# Patient Record
Sex: Female | Born: 1935 | Race: White | Hispanic: No | Marital: Married | Smoking: Current every day smoker
Health system: Southern US, Community
[De-identification: ages and names within clinical notes are randomized; demographics above are authoritative.]

## PROBLEM LIST (undated history)

## (undated) DIAGNOSIS — I10 Essential (primary) hypertension: Secondary | ICD-10-CM

## (undated) DIAGNOSIS — M899 Disorder of bone, unspecified: Secondary | ICD-10-CM

## (undated) DIAGNOSIS — D493 Neoplasm of unspecified behavior of breast: Secondary | ICD-10-CM

## (undated) DIAGNOSIS — E785 Hyperlipidemia, unspecified: Secondary | ICD-10-CM

## (undated) DIAGNOSIS — Z720 Tobacco use: Secondary | ICD-10-CM

## (undated) DIAGNOSIS — F419 Anxiety disorder, unspecified: Secondary | ICD-10-CM

## (undated) DIAGNOSIS — I2510 Atherosclerotic heart disease of native coronary artery without angina pectoris: Secondary | ICD-10-CM

## (undated) DIAGNOSIS — K219 Gastro-esophageal reflux disease without esophagitis: Secondary | ICD-10-CM

## (undated) DIAGNOSIS — T7840XA Allergy, unspecified, initial encounter: Secondary | ICD-10-CM

## (undated) DIAGNOSIS — M949 Disorder of cartilage, unspecified: Secondary | ICD-10-CM

## (undated) DIAGNOSIS — D126 Benign neoplasm of colon, unspecified: Secondary | ICD-10-CM

## (undated) HISTORY — DX: Allergy, unspecified, initial encounter: T78.40XA

## (undated) HISTORY — DX: Benign neoplasm of colon, unspecified: D12.6

## (undated) HISTORY — DX: Disorder of cartilage, unspecified: M94.9

## (undated) HISTORY — DX: Anxiety disorder, unspecified: F41.9

## (undated) HISTORY — DX: Atherosclerotic heart disease of native coronary artery without angina pectoris: I25.10

## (undated) HISTORY — DX: Gastro-esophageal reflux disease without esophagitis: K21.9

## (undated) HISTORY — DX: Hyperlipidemia, unspecified: E78.5

## (undated) HISTORY — DX: Essential (primary) hypertension: I10

## (undated) HISTORY — DX: Disorder of bone, unspecified: M89.9

## (undated) HISTORY — PX: CARDIAC CATHETERIZATION: SHX172

## (undated) HISTORY — DX: Tobacco use: Z72.0

## (undated) HISTORY — DX: Neoplasm of unspecified behavior of breast: D49.3

## (undated) HISTORY — PX: DESCENDING AORTIC ANEURYSM REPAIR W/ STENT: SHX1456

## (undated) HISTORY — PX: CAROTID ENDARTERECTOMY: SUR193

---

## 1997-12-11 ENCOUNTER — Encounter: Payer: Self-pay | Admitting: Internal Medicine

## 2004-05-12 ENCOUNTER — Encounter: Admission: RE | Admit: 2004-05-12 | Discharge: 2004-05-12 | Payer: Self-pay | Admitting: Cardiovascular Disease

## 2004-05-15 ENCOUNTER — Ambulatory Visit (HOSPITAL_COMMUNITY): Admission: RE | Admit: 2004-05-15 | Discharge: 2004-05-15 | Payer: Self-pay | Admitting: Cardiovascular Disease

## 2004-06-23 ENCOUNTER — Encounter (INDEPENDENT_AMBULATORY_CARE_PROVIDER_SITE_OTHER): Payer: Self-pay | Admitting: *Deleted

## 2004-06-23 ENCOUNTER — Inpatient Hospital Stay (HOSPITAL_COMMUNITY): Admission: RE | Admit: 2004-06-23 | Discharge: 2004-06-24 | Payer: Self-pay

## 2004-08-08 ENCOUNTER — Ambulatory Visit: Admission: RE | Admit: 2004-08-08 | Discharge: 2004-08-08 | Payer: Self-pay | Admitting: Cardiovascular Disease

## 2004-08-21 ENCOUNTER — Inpatient Hospital Stay (HOSPITAL_COMMUNITY): Admission: RE | Admit: 2004-08-21 | Discharge: 2004-08-23 | Payer: Self-pay | Admitting: Cardiovascular Disease

## 2005-12-18 ENCOUNTER — Ambulatory Visit: Payer: Self-pay | Admitting: Internal Medicine

## 2006-04-19 ENCOUNTER — Ambulatory Visit: Payer: Self-pay | Admitting: Internal Medicine

## 2006-05-30 ENCOUNTER — Ambulatory Visit: Payer: Self-pay | Admitting: Internal Medicine

## 2006-06-18 IMAGING — CT CT PELVIS W/O CM
1 series · 15 of 32 positions shown, 19 images · non-contrast
Comparison: None. 
ABDOMEN:

CLINICAL DATA: Status post cardiac cath.  Evaluate for retroperitoneal hemorrhage.
ABDOMEN AND PELVIS CT WITHOUT CONTRAST ? 08/22/04

[Series 2: abd pelvis · axial · 0.70mm/px · z∈[-417,-52]mm · 15 of 82 slices shown, 19 images]
[im 6/82  soft-tissue]
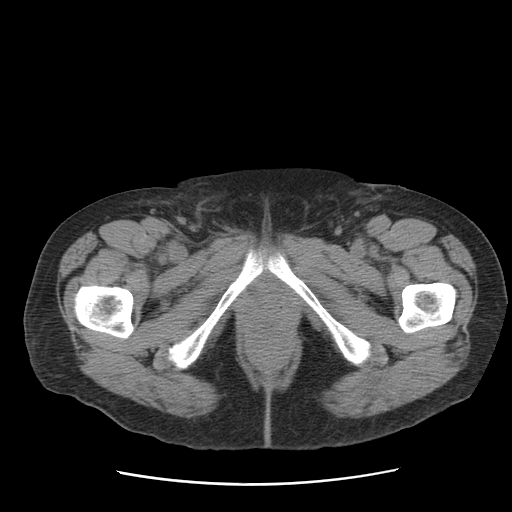
[im 6/82  bone]
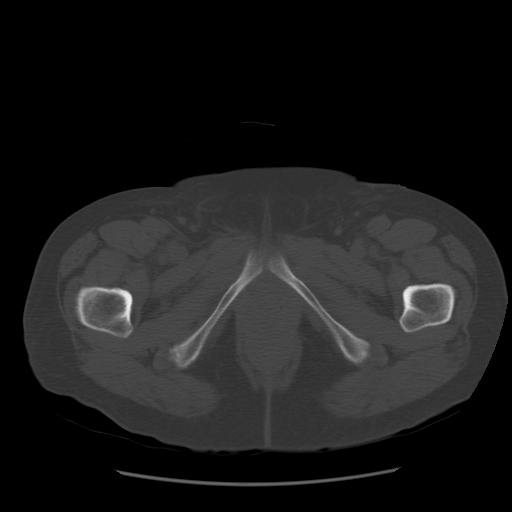
[im 11/82  soft-tissue]
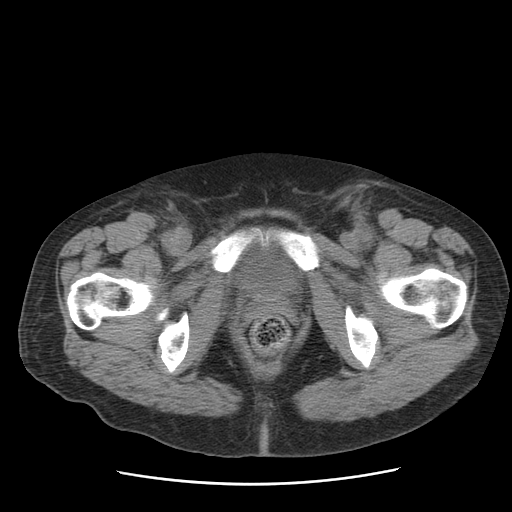
[im 16/82  soft-tissue]
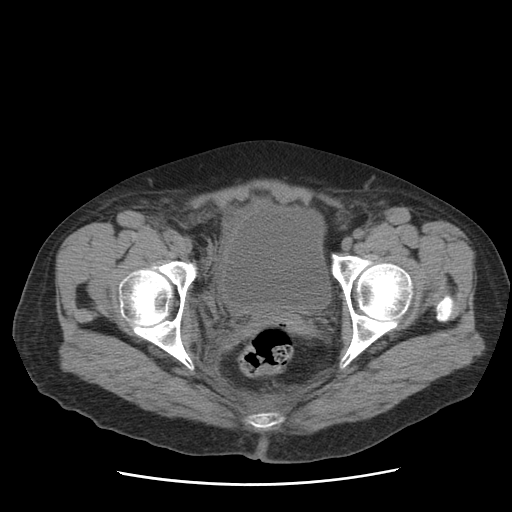
[im 24/82  soft-tissue]
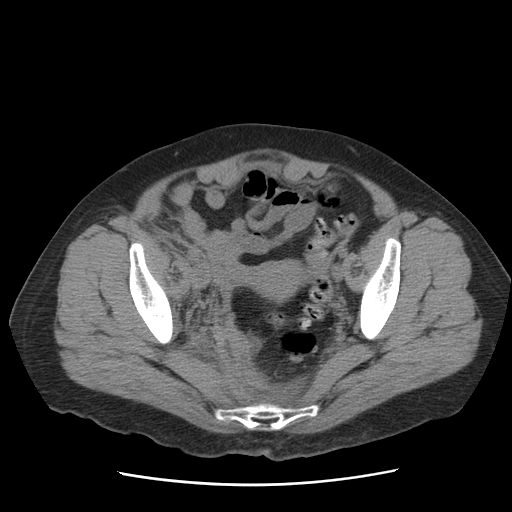
[im 29/82  soft-tissue]
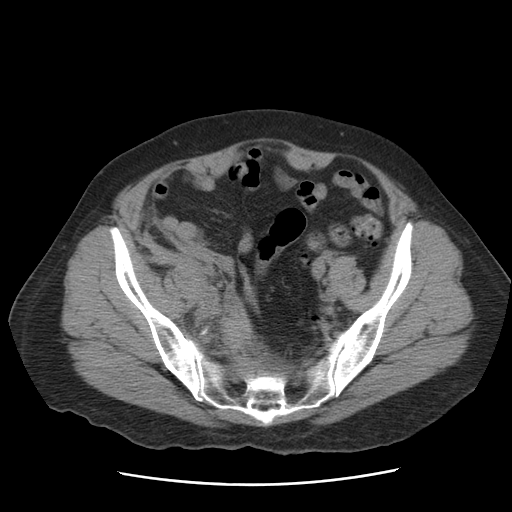
[im 34/82  soft-tissue]
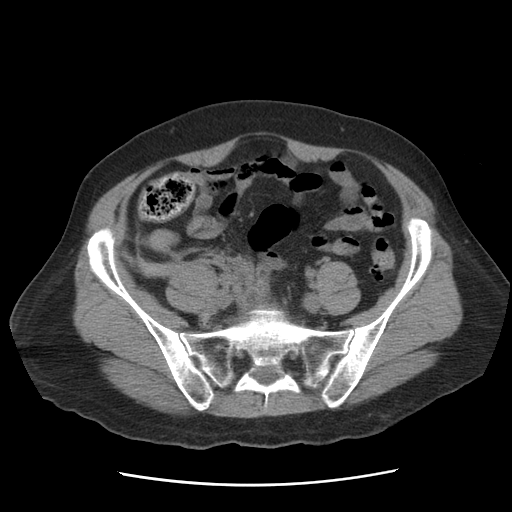
[im 42/82  soft-tissue]
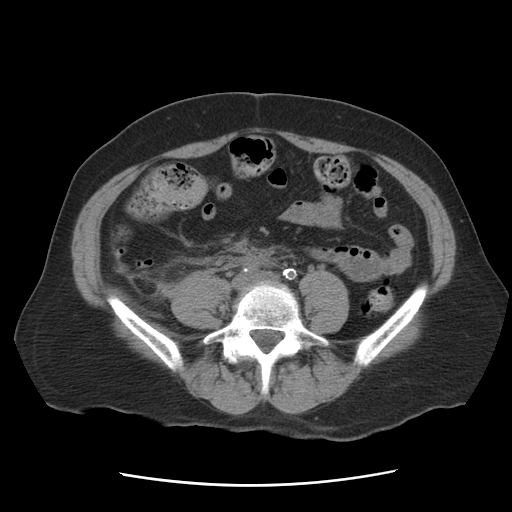
[im 48/82  soft-tissue]
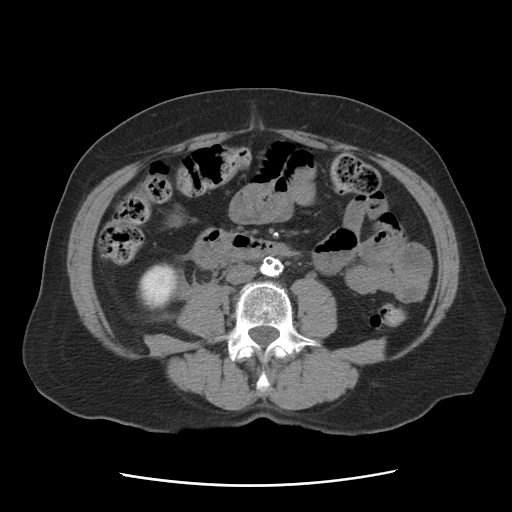
[im 53/82  soft-tissue]
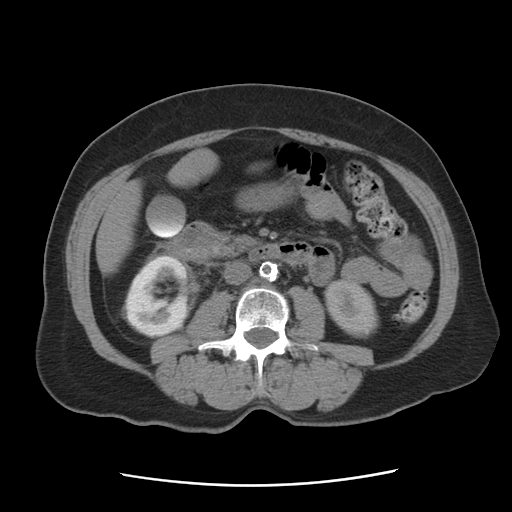
[im 53/82  bone]
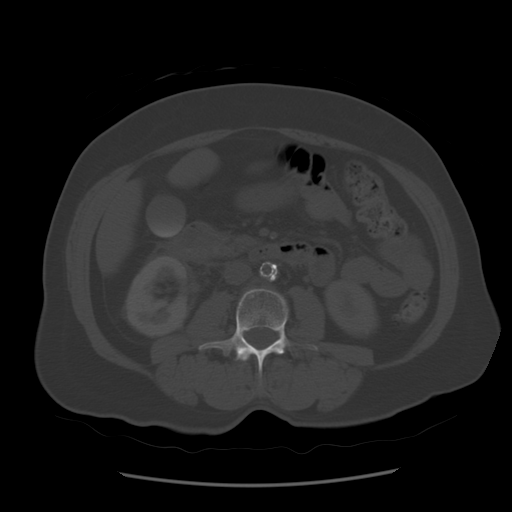
[im 58/82  soft-tissue]
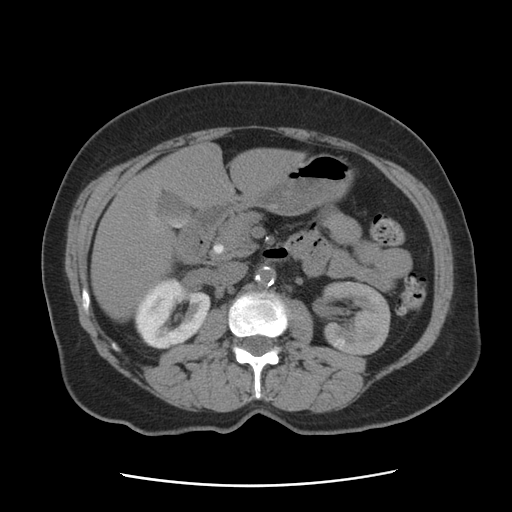
[im 66/82  soft-tissue]
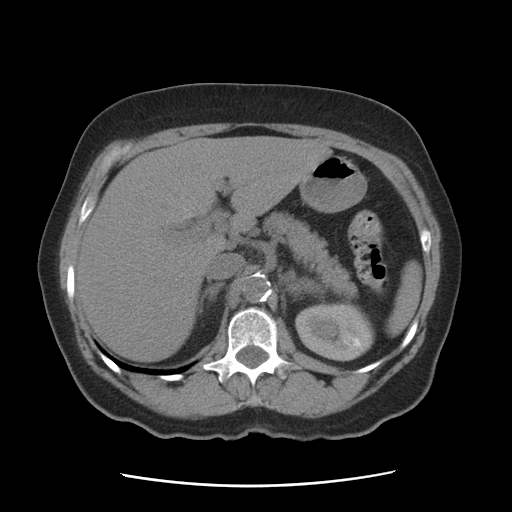
[im 71/82  soft-tissue]
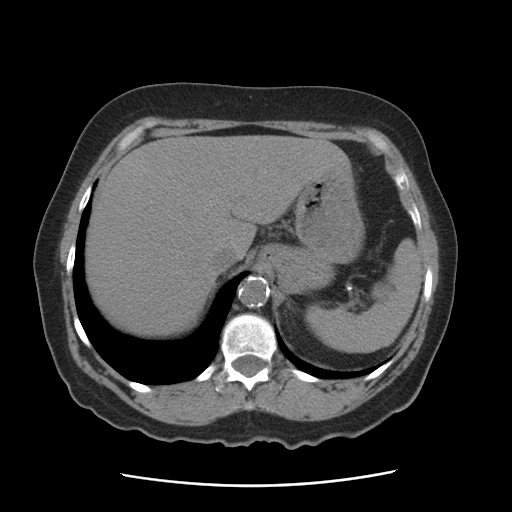
[im 71/82  lung]
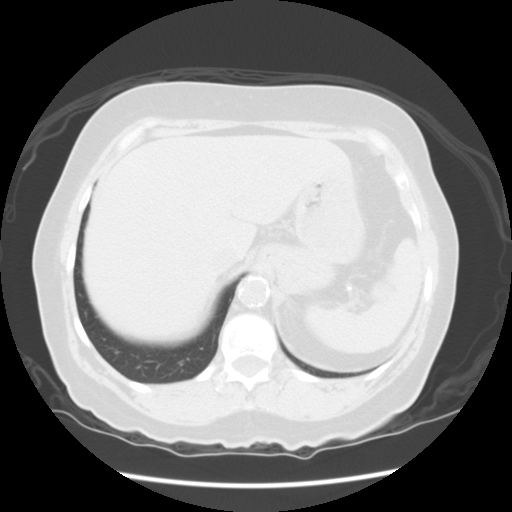
[im 74/82  lung]
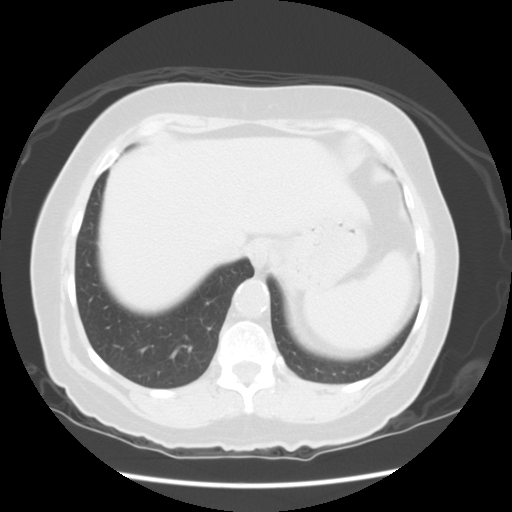
[im 76/82  soft-tissue]
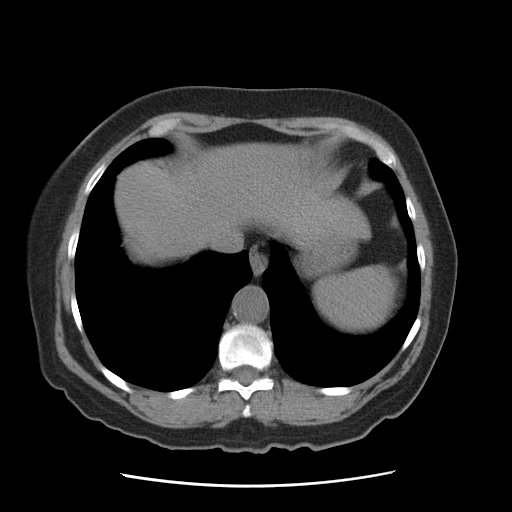
[im 76/82  lung]
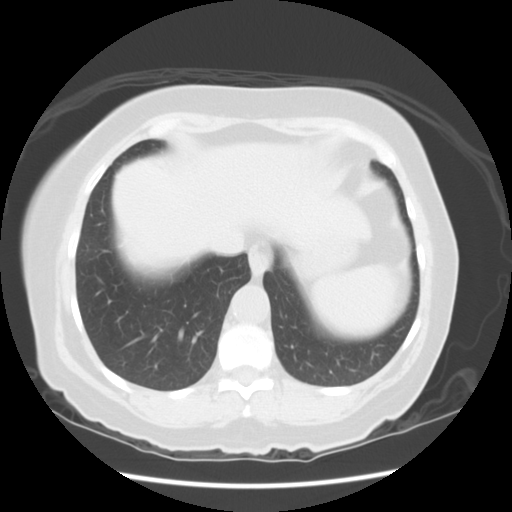
[im 79/82  lung]
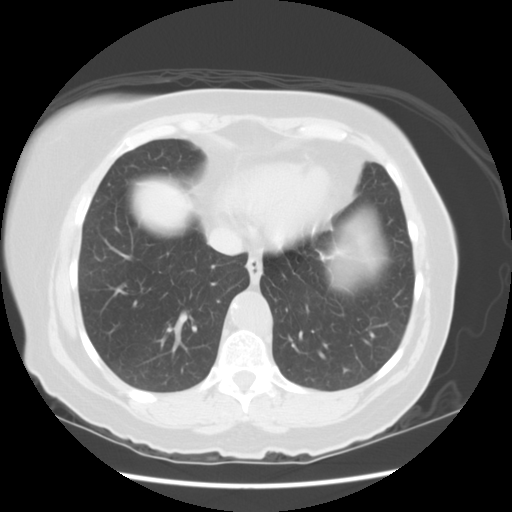

[15 of 32 positions shown; findings below may reference images not displayed]

FINDINGS: The liver and spleen have a normal uninfused appearance.  There is contrast in the gallbladder lumen, common duct, and duodenum consistent with vicarious excretion of contrast from the patient?s recent heart catheterization.  There is a persistent dense right nephrogram with persistent enhancement of the left kidney noted as well.  he asymmetric appearance and persistence of the dense cortical nephrogram on the right is worrisome for superimposed decreased perfusion to the right kidney.  There is associated right perinephric interstitial edema.  
The pancreas, adrenal glands, and abdominal bowel loops are unremarkable.
IMPRESSION
Bilateral delayed nephrograms suggest compromise of renal function and asymmetric retention of contrast in the right kidney raises the question of superimposed vascular insufficiency.
PELVIS:
FINDINGS: Imaging through the anatomic pelvis reveals a distal aortic stent with bilateral iliac stents.  There is hemorrhage within the extraperitoneal soft tissues of the right pelvic sidewall which extends into the pelvic floor.  
No evidence for intraperitoneal free fluid.  Diverticular change is seen in the sigmoid colon without evidence for diverticulitis.
IMPRESSION
Intraperitoneal hemorrhage tracks in the intraperitoneal soft tissues from the right groin cranially through the pelvic sidewall extraperitoneal tissues and into the right retroperitoneal space.  
  These results were discussed with Dr. Tegen by telephone at approximately [DATE] a.m. on 08/22/04.

## 2006-07-17 ENCOUNTER — Ambulatory Visit: Payer: Self-pay | Admitting: Family Medicine

## 2006-10-03 ENCOUNTER — Ambulatory Visit: Payer: Self-pay | Admitting: Internal Medicine

## 2006-12-19 ENCOUNTER — Encounter: Payer: Self-pay | Admitting: Internal Medicine

## 2007-02-17 ENCOUNTER — Encounter: Payer: Self-pay | Admitting: Internal Medicine

## 2007-02-25 ENCOUNTER — Ambulatory Visit: Payer: Self-pay | Admitting: Internal Medicine

## 2007-02-25 LAB — CONVERTED CEMR LAB
ALT: 16 units/L (ref 0–40)
AST: 20 units/L (ref 0–37)
Albumin: 4 g/dL (ref 3.5–5.2)
BUN: 8 mg/dL (ref 6–23)
Bacteria, UA: NEGATIVE
CO2: 30 meq/L (ref 19–32)
Cholesterol: 148 mg/dL (ref 0–200)
Creatinine, Ser: 0.8 mg/dL (ref 0.4–1.2)
Crystals: NEGATIVE
Eosinophils Relative: 1.4 % (ref 0.0–5.0)
GFR calc non Af Amer: 75 mL/min
Glucose, Bld: 105 mg/dL — ABNORMAL HIGH (ref 70–99)
HCT: 44 % (ref 36.0–46.0)
Hemoglobin, Urine: NEGATIVE
Hemoglobin: 15.3 g/dL — ABNORMAL HIGH (ref 12.0–15.0)
Ketones, ur: NEGATIVE mg/dL
Leukocytes, UA: NEGATIVE
Lymphocytes Relative: 40.8 % (ref 12.0–46.0)
MCV: 91 fL (ref 78.0–100.0)
Monocytes Relative: 5.1 % (ref 3.0–11.0)
Mucus, UA: NEGATIVE
Neutro Abs: 4.6 10*3/uL (ref 1.4–7.7)
Potassium: 4.1 meq/L (ref 3.5–5.1)
RBC: 4.84 M/uL (ref 3.87–5.11)
RDW: 12.4 % (ref 11.5–14.6)
Sodium: 135 meq/L (ref 135–145)
TSH: 0.64 microintl units/mL (ref 0.35–5.50)
Total Protein, Urine: NEGATIVE mg/dL
Urobilinogen, UA: 0.2 (ref 0.0–1.0)
VLDL: 35 mg/dL (ref 0–40)
WBC: 8.6 10*3/uL (ref 4.5–10.5)
pH: 7 (ref 5.0–8.0)

## 2007-08-12 ENCOUNTER — Encounter: Payer: Self-pay | Admitting: Internal Medicine

## 2007-08-12 DIAGNOSIS — M899 Disorder of bone, unspecified: Secondary | ICD-10-CM | POA: Insufficient documentation

## 2007-08-12 DIAGNOSIS — E785 Hyperlipidemia, unspecified: Secondary | ICD-10-CM | POA: Insufficient documentation

## 2007-08-12 DIAGNOSIS — M949 Disorder of cartilage, unspecified: Secondary | ICD-10-CM

## 2007-08-12 DIAGNOSIS — I2510 Atherosclerotic heart disease of native coronary artery without angina pectoris: Secondary | ICD-10-CM | POA: Insufficient documentation

## 2007-08-12 DIAGNOSIS — F39 Unspecified mood [affective] disorder: Secondary | ICD-10-CM

## 2007-08-12 DIAGNOSIS — I10 Essential (primary) hypertension: Secondary | ICD-10-CM

## 2007-08-12 DIAGNOSIS — K449 Diaphragmatic hernia without obstruction or gangrene: Secondary | ICD-10-CM | POA: Insufficient documentation

## 2007-08-16 DIAGNOSIS — J309 Allergic rhinitis, unspecified: Secondary | ICD-10-CM | POA: Insufficient documentation

## 2007-08-16 DIAGNOSIS — K219 Gastro-esophageal reflux disease without esophagitis: Secondary | ICD-10-CM | POA: Insufficient documentation

## 2007-08-25 ENCOUNTER — Ambulatory Visit: Payer: Self-pay | Admitting: Internal Medicine

## 2007-09-09 ENCOUNTER — Telehealth (INDEPENDENT_AMBULATORY_CARE_PROVIDER_SITE_OTHER): Payer: Self-pay | Admitting: *Deleted

## 2007-12-12 ENCOUNTER — Telehealth: Payer: Self-pay | Admitting: Internal Medicine

## 2007-12-29 ENCOUNTER — Encounter: Payer: Self-pay | Admitting: Internal Medicine

## 2008-02-12 ENCOUNTER — Telehealth (INDEPENDENT_AMBULATORY_CARE_PROVIDER_SITE_OTHER): Payer: Self-pay | Admitting: *Deleted

## 2008-02-24 ENCOUNTER — Ambulatory Visit: Payer: Self-pay | Admitting: Internal Medicine

## 2008-02-24 LAB — CONVERTED CEMR LAB
BUN: 7 mg/dL (ref 6–23)
Calcium: 9.8 mg/dL (ref 8.4–10.5)
Cholesterol: 149 mg/dL (ref 0–200)
Creatinine, Ser: 0.8 mg/dL (ref 0.4–1.2)
GFR calc Af Amer: 91 mL/min
Total CHOL/HDL Ratio: 4.2
Triglycerides: 164 mg/dL — ABNORMAL HIGH (ref 0–149)
VLDL: 33 mg/dL (ref 0–40)

## 2008-02-26 LAB — CONVERTED CEMR LAB
ALT: 21 units/L (ref 0–35)
AST: 16 units/L (ref 0–37)
BUN: 6 mg/dL (ref 6–23)
Basophils Relative: 0.9 % (ref 0.0–1.0)
CO2: 31 meq/L (ref 19–32)
Calcium: 9.9 mg/dL (ref 8.4–10.5)
Eosinophils Relative: 0.7 % (ref 0.0–5.0)
Glucose, Bld: 105 mg/dL — ABNORMAL HIGH (ref 70–99)
HCT: 44.1 % (ref 36.0–46.0)
HDL: 41.8 mg/dL (ref >39.0)
LDL Cholesterol: 93 mg/dL (ref 0–99)
Lymphocytes Relative: 36.1 % (ref 12.0–46.0)
Neutro Abs: 5.8 10*3/uL (ref 1.4–7.7)
Potassium: 4.5 meq/L (ref 3.5–5.1)
Total Bilirubin: 0.7 mg/dL (ref 0.3–1.2)
Total Protein: 7.3 g/dL (ref 6.0–8.3)
Triglycerides: 109 mg/dL (ref 0–149)
VLDL: 22 mg/dL (ref 0–40)
WBC: 10.6 10*3/uL — ABNORMAL HIGH (ref 4.5–10.5)

## 2008-06-15 ENCOUNTER — Telehealth (INDEPENDENT_AMBULATORY_CARE_PROVIDER_SITE_OTHER): Payer: Self-pay | Admitting: *Deleted

## 2008-08-27 ENCOUNTER — Ambulatory Visit: Payer: Self-pay | Admitting: Internal Medicine

## 2008-08-30 LAB — CONVERTED CEMR LAB
AST: 20 units/L (ref 0–37)
CO2: 27 meq/L (ref 19–32)
Calcium: 9.6 mg/dL (ref 8.4–10.5)
Chloride: 101 meq/L (ref 96–112)
Cholesterol: 152 mg/dL (ref 0–200)
GFR calc non Af Amer: 75 mL/min
Glucose, Bld: 89 mg/dL (ref 70–99)
LDL Cholesterol: 89 mg/dL (ref 0–99)
Phosphorus: 3.3 mg/dL (ref 2.3–4.6)
Potassium: 4.1 meq/L (ref 3.5–5.1)
Total Bilirubin: 0.6 mg/dL (ref 0.3–1.2)
Total Protein: 7.7 g/dL (ref 6.0–8.3)
Triglycerides: 140 mg/dL (ref 0–149)
VLDL: 28 mg/dL (ref 0–40)

## 2008-10-06 ENCOUNTER — Telehealth: Payer: Self-pay | Admitting: Internal Medicine

## 2009-01-04 ENCOUNTER — Telehealth: Payer: Self-pay | Admitting: Internal Medicine

## 2009-02-03 ENCOUNTER — Encounter: Payer: Self-pay | Admitting: Internal Medicine

## 2009-03-28 ENCOUNTER — Ambulatory Visit: Payer: Self-pay | Admitting: Internal Medicine

## 2009-03-29 LAB — CONVERTED CEMR LAB
ALT: 18 units/L (ref 0–35)
Albumin: 4.2 g/dL (ref 3.5–5.2)
Alkaline Phosphatase: 68 units/L (ref 39–117)
BUN: 7 mg/dL (ref 6–23)
Calcium: 10 mg/dL (ref 8.4–10.5)
Creatinine, Ser: 0.7 mg/dL (ref 0.4–1.2)
Glucose, Bld: 101 mg/dL — ABNORMAL HIGH (ref 70–99)
Total CHOL/HDL Ratio: 4
Total Protein: 7.2 g/dL (ref 6.0–8.3)

## 2009-04-19 ENCOUNTER — Telehealth: Payer: Self-pay | Admitting: Internal Medicine

## 2009-05-16 ENCOUNTER — Telehealth: Payer: Self-pay | Admitting: Internal Medicine

## 2009-06-03 ENCOUNTER — Ambulatory Visit: Payer: Self-pay | Admitting: Internal Medicine

## 2009-06-03 ENCOUNTER — Telehealth: Payer: Self-pay | Admitting: Internal Medicine

## 2009-08-08 ENCOUNTER — Telehealth: Payer: Self-pay | Admitting: Internal Medicine

## 2009-08-25 ENCOUNTER — Telehealth: Payer: Self-pay | Admitting: Internal Medicine

## 2009-08-26 ENCOUNTER — Ambulatory Visit: Payer: Self-pay | Admitting: Internal Medicine

## 2009-09-01 ENCOUNTER — Encounter: Payer: Self-pay | Admitting: Internal Medicine

## 2009-09-27 ENCOUNTER — Ambulatory Visit: Payer: Self-pay | Admitting: Internal Medicine

## 2009-11-14 ENCOUNTER — Ambulatory Visit: Payer: Self-pay | Admitting: Internal Medicine

## 2009-11-15 ENCOUNTER — Encounter: Payer: Self-pay | Admitting: Internal Medicine

## 2009-11-15 LAB — CONVERTED CEMR LAB
Alkaline Phosphatase: 56 units/L (ref 39–117)
Basophils Absolute: 0 10*3/uL (ref 0.0–0.1)
Basophils Relative: 0.5 % (ref 0.0–3.0)
CO2: 30 meq/L (ref 19–32)
Calcium: 9.8 mg/dL (ref 8.4–10.5)
Cholesterol: 180 mg/dL (ref 0–200)
Creatinine, Ser: 0.8 mg/dL (ref 0.4–1.2)
Eosinophils Absolute: 0.1 10*3/uL (ref 0.0–0.7)
GFR calc non Af Amer: 74.48 mL/min (ref >60)
Glucose, Bld: 75 mg/dL (ref 70–99)
Lymphocytes Relative: 41.2 % (ref 12.0–46.0)
Lymphs Abs: 4.1 10*3/uL — ABNORMAL HIGH (ref 0.7–4.0)
MCV: 95.3 fL (ref 78.0–100.0)
Monocytes Absolute: 0.7 10*3/uL (ref 0.1–1.0)
Phosphorus: 3.3 mg/dL (ref 2.3–4.6)
RBC: 4.43 M/uL (ref 3.87–5.11)
Sodium: 136 meq/L (ref 135–145)
TSH: 0.9 microintl units/mL (ref 0.35–5.50)
Triglycerides: 177 mg/dL — ABNORMAL HIGH (ref 0.0–149.0)

## 2010-01-26 ENCOUNTER — Telehealth: Payer: Self-pay | Admitting: Internal Medicine

## 2010-02-21 ENCOUNTER — Ambulatory Visit: Payer: Self-pay | Admitting: Internal Medicine

## 2010-03-07 ENCOUNTER — Telehealth: Payer: Self-pay | Admitting: Internal Medicine

## 2010-03-30 ENCOUNTER — Telehealth: Payer: Self-pay | Admitting: Internal Medicine

## 2010-05-05 ENCOUNTER — Telehealth (INDEPENDENT_AMBULATORY_CARE_PROVIDER_SITE_OTHER): Payer: Self-pay | Admitting: *Deleted

## 2010-05-25 ENCOUNTER — Telehealth (INDEPENDENT_AMBULATORY_CARE_PROVIDER_SITE_OTHER): Payer: Self-pay | Admitting: *Deleted

## 2010-05-29 ENCOUNTER — Telehealth: Payer: Self-pay | Admitting: Internal Medicine

## 2010-05-30 ENCOUNTER — Encounter (INDEPENDENT_AMBULATORY_CARE_PROVIDER_SITE_OTHER): Payer: Self-pay | Admitting: *Deleted

## 2010-05-31 ENCOUNTER — Telehealth: Payer: Self-pay | Admitting: Internal Medicine

## 2010-06-01 ENCOUNTER — Ambulatory Visit: Payer: Self-pay | Admitting: Internal Medicine

## 2010-06-23 ENCOUNTER — Telehealth: Payer: Self-pay | Admitting: Family Medicine

## 2010-08-15 ENCOUNTER — Ambulatory Visit: Payer: Self-pay | Admitting: Internal Medicine

## 2010-08-17 LAB — CONVERTED CEMR LAB
Basophils Absolute: 0.1 10*3/uL (ref 0.0–0.1)
Basophils Relative: 0.7 % (ref 0.0–3.0)
CO2: 31 meq/L (ref 19–32)
Calcium: 9.6 mg/dL (ref 8.4–10.5)
Chloride: 100 meq/L (ref 96–112)
Direct LDL: 76.3 mg/dL
Eosinophils Absolute: 0.1 10*3/uL (ref 0.0–0.7)
GFR calc non Af Amer: 58.12 mL/min (ref >60)
Glucose, Bld: 80 mg/dL (ref 70–99)
HDL: 34.9 mg/dL — ABNORMAL LOW (ref >39.00)
Lymphocytes Relative: 31.3 % (ref 12.0–46.0)
Lymphs Abs: 3.1 10*3/uL (ref 0.7–4.0)
MCHC: 34.2 g/dL (ref 30.0–36.0)
Monocytes Absolute: 0.7 10*3/uL (ref 0.1–1.0)
Monocytes Relative: 6.9 % (ref 3.0–12.0)
Phosphorus: 2.8 mg/dL (ref 2.3–4.6)
Platelets: 314 10*3/uL (ref 150.0–400.0)
Potassium: 3.8 meq/L (ref 3.5–5.1)
RBC: 4.39 M/uL (ref 3.87–5.11)
Sodium: 138 meq/L (ref 135–145)
TSH: 0.92 microintl units/mL (ref 0.35–5.50)
Total Protein: 7.5 g/dL (ref 6.0–8.3)

## 2010-08-29 ENCOUNTER — Encounter: Payer: Self-pay | Admitting: Internal Medicine

## 2010-09-11 ENCOUNTER — Telehealth: Payer: Self-pay | Admitting: Internal Medicine

## 2010-09-12 HISTORY — PX: CARDIOVASCULAR STRESS TEST: SHX262

## 2010-09-27 ENCOUNTER — Ambulatory Visit: Payer: Self-pay | Admitting: Internal Medicine

## 2010-09-28 ENCOUNTER — Encounter: Payer: Self-pay | Admitting: Internal Medicine

## 2010-09-28 HISTORY — PX: US ECHOCARDIOGRAPHY: HXRAD669

## 2010-10-30 ENCOUNTER — Ambulatory Visit: Payer: Self-pay | Admitting: Family Medicine

## 2010-10-30 DIAGNOSIS — F17200 Nicotine dependence, unspecified, uncomplicated: Secondary | ICD-10-CM | POA: Insufficient documentation

## 2010-11-10 ENCOUNTER — Encounter: Payer: Self-pay | Admitting: Internal Medicine

## 2010-12-05 ENCOUNTER — Encounter: Payer: Self-pay | Admitting: Cardiovascular Disease

## 2010-12-12 ENCOUNTER — Telehealth: Payer: Self-pay | Admitting: Family Medicine

## 2011-01-02 ENCOUNTER — Telehealth: Payer: Self-pay | Admitting: Internal Medicine

## 2011-01-09 NOTE — Progress Notes (Signed)
Summary: ALPRAZOLAM  Phone Note Refill Request Message from:  [place] [address] on January 26, 2010 4:01 PM  Refills Requested: Medication #1:  ALPRAZOLAM 0.5 MG  TABS Take 1/2-1  by mouth two times a day as needed for nerves   Last Refilled: 11/23/2009 Form on your desk    Method Requested: Fax to Local Pharmacy Initial call taken by: Mervin Hack CMA [facility]),  January 26, 2010 4:02 PM  Follow-up for Phone Call        okay #60 x 0 Follow-up by: Cindee Salt MD,  January 26, 2010 5:23 PM  Additional Follow-up for Phone Call Additional follow up Details #1::        Rx faxed to pharmacy Additional Follow-up by: DeShannon Smith CMA [facility]),  January 27, 2010 8:07 AM    Prescriptions: ALPRAZOLAM 0.5 MG  TABS (ALPRAZOLAM) Take 1/2-1  by mouth two times a day as needed for nerves  #60 x 0   Entered by:   Mervin Hack CMA (AAMA)   Authorized by:   Cindee Salt MD   Signed by:   Mervin Hack CMA (AAMA) on 01/27/2010   Method used:   Handwritten   RxID:   [id number]

## 2011-01-09 NOTE — Progress Notes (Signed)
Summary: refill request for alprazolam  Phone Note Refill Request Message from:  Fax from Pharmacy  Refills Requested: Medication #1:  ALPRAZOLAM 0.5 MG TABS Take 1/2-1  by mouth two times a day as needed for nerves.   Last Refilled: 01/27/2010 Faxed request from [facility] is on your desk.  Initial call taken by: Lowella Petties CMA,  March 30, 2010 12:44 PM  Follow-up for Phone Call        okay #60 x 1 Follow-up by: Cindee Salt MD,  March 30, 2010 1:18 PM  Additional Follow-up for Phone Call Additional follow up Details #1::        Rx faxed to pharmacy Additional Follow-up by: DeShannon Smith CMA [facility]),  March 30, 2010 2:14 PM    Prescriptions: ALPRAZOLAM 0.5 MG TABS (ALPRAZOLAM) Take 1/2-1  by mouth two times a day as needed for nerves  #60 x 1   Entered by:   Mervin Hack CMA (AAMA)   Authorized by:   Cindee Salt MD   Signed by:   Mervin Hack CMA (AAMA) on 03/30/2010   Method used:   Handwritten   RxID:   [id number]

## 2011-01-09 NOTE — Progress Notes (Signed)
Summary: went back to alprazolam  Phone Note Call from Patient Call back at Home Phone [phone]   Caller: Mom Call For: Cindee Salt MD Summary of Call: Pt states she was changed from alprazolam to lorazepam and the lorazepam is not doing her any good.  She has gone back to taking the alprazolam.  She wanted this noted in her chart. Initial call taken by: Lowella Petties CMA,  March 07, 2010 11:46 AM  Follow-up for Phone Call        please adjust the med list to reflect this Follow-up by: Cindee Salt MD,  March 07, 2010 1:46 PM  Additional Follow-up for Phone Call Additional follow up Details #1::        done Additional Follow-up by: Mervin Hack CMA [facility]),  March 07, 2010 2:28 PM    New/Updated Medications: ALPRAZOLAM 0.5 MG TABS (ALPRAZOLAM) Take 1/2-1  by mouth two times a day as needed for nerves

## 2011-01-09 NOTE — Assessment & Plan Note (Signed)
Summary: 1:30 ?SHINGLES/CLE   Vital Signs:  Patient profile:   77 year old female Weight:      124 pounds BMI:     20.71 Temp:     98.5 degrees F oral BP sitting:   160 / 80  (left arm) Cuff size:   regular  Vitals Entered By: Mervin Hack CMA [facility]) (June 01, 2010 1:33 PM) CC: Rash   History of Present Illness: Thinks she has shingles Had bad episode back in 2004 did get zostavax last year  Lots of stress lately (not from husband fortunately)  2 nights ago--start with lightlning type pain along right flank No pain or itching this AM but noted rash when drying herself after bathing this AM Now mild itching  No fever localized to under left breast  Allergies: 1)  ! Wellbutrin (Bupropion Hcl) 2)  ! Losartan Potassium-Hctz (Losartan Potassium-Hctz) 3)  Sulfa 4)  Tussionex Pennkinetic Er (Chlorpheniramine-Hydrocodone) 5)  Sertraline Hcl (Sertraline Hcl)  Past History:  Past medical, surgical, family and social histories (including risk factors) reviewed for relevance to current acute and chronic problems.  Past Medical History: Reviewed history from 02/24/2008 and no changes required. Anxiety Hyperlipidemia Hypertension Osteopenia Allergic rhinitis ASCVD------------------------------------------------------Dr Allyson Sabal GERD/hiatal hernia  Past Surgical History: Reviewed history from 08/12/2007 and no changes required. Left CEA 07/05 Stent- abd aorta 09/05 Left breast benign tumor 1960's Cataract left then right 07/07--08/07 Cardiolite negative 05/05  Family History: Reviewed history from 08/12/2007 and no changes required. Father: Died at age 88, CVA's, ? Parkinson's Mother: Died, ? CAD, CVA, died after hip fx Siblings: One brother living with diabetes, one sister deceased with lung cancer Not sure if CAD in family HBP in  Mom DM on Mother's side, brother No breast or colon cancer  Social History: Reviewed history from 08/12/2007 and no changes  required.  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   Skin:  classic clump of broken vesicles on red base  ~T10 anteriorly under right breast   Impression & Recommendations:  Problem # 1:  SHINGLES (ICD-053.9) Assessment New recurrent from years ago did have zostavax and this may decrease severity will fill hydrocodone if pain worsens antiviral Rx given  Complete Medication List: 1)  Plavix 75 Mg Tabs (Clopidogrel bisulfate) .... Take one by mouth once a day 2)  Amlodipine Besylate 5 Mg Tabs (Amlodipine besylate) .... Take 1 tablet by mouth once a day 3)  Simvastatin 40 Mg Tabs (Simvastatin) .... Take 1 tablet by mouth once daily 4)  Celebrex 200 Mg Caps (Celecoxib) .... Take 1 by mouth once daily 5)  Diovan Hct 80-12.5 Mg Tabs (Valsartan-hydrochlorothiazide) .... Take one half tablet daily 6)  Alprazolam 0.5 Mg Tabs (Alprazolam) .... Take 1/2-1  by mouth two times a day as needed for nerves 7)  Aspirin 81 Mg Tbec (Aspirin) .... Take one by mouth once day 8)  Calcium 500/d 500-200 Mg-unit Tabs (Calcium carbonate-vitamin d) .... Take 1 by mouth once daily 9)  Fluticasone Propionate 50 Mcg/act Susp (Fluticasone propionate) .... 2 sprays each nostril daily for allergy congestion 10)  L-lysine 500 Mg Tabs (Lysine) .... Once daily 11)  Valacyclovir Hcl 1 Gm Tabs (Valacyclovir hcl) .Marland Kitchen.. 1 tab  by mouth three times a day for shingles 12)  Hydrocodone-acetaminophen 5-325 Mg Tabs (Hydrocodone-acetaminophen) .Marland Kitchen.. 1 tab by mouth three times a day as needed for severe shingles pain  Patient Instructions: 1)  Please start the valacyclovir today and refill if not completely resolved in 1 week 2)  Fill the pain meds if  the pain gets worse Prescriptions: HYDROCODONE-ACETAMINOPHEN 5-325 MG TABS (HYDROCODONE-ACETAMINOPHEN) 1 tab by mouth three times a day as needed for severe shingles pain  #30 x 0   Entered and Authorized by:   Cindee Salt MD   Signed by:   Cindee Salt MD on 06/01/2010   Method used:   Print then Give to Patient   RxID:   [id number] VALACYCLOVIR HCL 1 GM TABS (VALACYCLOVIR HCL) 1 tab by mouth three times a day for shingles  #21 x 0   Entered and Authorized by:   Cindee Salt MD   Signed by:   Cindee Salt MD on 06/01/2010   Method used:   Electronically to        [facility]* (retail)       [address] [place], [place]  [address]       Ph: [phone]       Fax: [phone]   RxID:   [id number]   Current Allergies (reviewed today): ! WELLBUTRIN (BUPROPION HCL) ! LOSARTAN POTASSIUM-HCTZ (LOSARTAN POTASSIUM-HCTZ) SULFA TUSSIONEX PENNKINETIC ER (CHLORPHENIRAMINE-HYDROCODONE) SERTRALINE HCL (SERTRALINE HCL)

## 2011-01-09 NOTE — Progress Notes (Signed)
Summary: ALPRAZOLAM  Phone Note Refill Request Message from:  [facility] [phone] on June 23, 2010 9:19 AM  Refills Requested: Medication #1:  ALPRAZOLAM 0.5 MG TABS Take 1/2-1  by mouth two times a day as needed for nerves   Last Refilled: 05/19/2010 Dr. Karle Starch patient   Method Requested: Telephone to Pharmacy Initial call taken by: Janee Morn CMA,  June 23, 2010 9:20 AM  Follow-up for Phone Call        Rx called to pharmacy Follow-up by: Linde Gillis CMA [facility]),  June 23, 2010 10:24 AM    Prescriptions: ALPRAZOLAM 0.5 MG TABS (ALPRAZOLAM) Take 1/2-1  by mouth two times a day as needed for nerves  #60 x 1   Entered and Authorized by:   Ruthe Mannan MD   Signed by:   Ruthe Mannan MD on 06/23/2010   Method used:   Telephoned to ...       [facility]* (retail)       [address] [place], [place]  [address]       Ph: [phone]       Fax: [phone]   RxID:   [id number]

## 2011-01-09 NOTE — Letter (Signed)
Summary: [facility]  [facility]   Imported By: Lester [place] 09/07/2010 11:42:35  _____________________________________________________________________  External Attachment:    Type:   Image     Comment:   External Document  Appended Document: [facility] stable--no changes  Checking echo

## 2011-01-09 NOTE — Assessment & Plan Note (Signed)
Summary: infection on foot/alc   Vital Signs:  Patient profile:   77 year old female Weight:      126.25 pounds Temp:     98.8 degrees F oral Pulse rate:   96 / minute Pulse rhythm:   regular BP sitting:   160 / 90  (left arm) Cuff size:   regular  Vitals Entered By: Selena Batten Dance CMA [facility]) (October 30, 2010 2:27 PM) CC: Left foot infection   History of Present Illness: CC: check foot  Pt on ASA, plavix for CAD, HTN, HLD  10/17/2010 - hit L foot on rocker chair bottom, caused abrasion that didn't really bleed.  + pain, bruising and swelling initially but helped with ice, stocking.  coming in today because skin around abrasion was very red last night.  Today looking better.  Treating with peroxide, betadine and neosporin.    Current Medications (verified): 1)  Plavix 75 Mg  Tabs (Clopidogrel Bisulfate) .... Take One By Mouth Once A Day 2)  Amlodipine Besylate 5 Mg  Tabs (Amlodipine Besylate) .... Take 1 Tablet By Mouth Once A Day 3)  Simvastatin 40 Mg Tabs (Simvastatin) .... Take 1 Tablet By Mouth Once Daily 4)  Celebrex 200 Mg Caps (Celecoxib) .... Take 1 By Mouth Once Daily 5)  Diovan Hct 80-12.5 Mg Tabs (Valsartan-Hydrochlorothiazide) .... Take One Half Tablet Daily 6)  Alprazolam 0.5 Mg Tabs (Alprazolam) .... Take 1/2-1  By Mouth Two Times A Day As Needed For Nerves 7)  Aspirin 81 Mg  Tbec (Aspirin) .... Take One By Mouth Once Day 8)  Calcium 500/d 500-200 Mg-Unit Tabs (Calcium Carbonate-Vitamin D) .... Take 1 By Mouth Once Daily  Allergies: 1)  ! Wellbutrin (Bupropion Hcl) 2)  ! 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   Msk:  L leg nontender, ankle Pulses:  1+ DP/PT bilat Extremities:  no pedal edema Neurologic:  sensation intact Skin:  L dorsal lateral midfoot with dry abrasion, some erythema surrounding demarcated with sharpie, slight swelling.  No induration, fluctuance.  no bleeding or pus.   Impression & Recommendations:  Problem # 1:  ABRASION, FOOT, INFECTED (ICD-917.1) minimally infected, doubt will need abx.  provided with doxy script to hold on to in case not better into thanksgiving.  red flags to return discussed.  demarcated erythema with sharpe, advised if spreading to start abx, if not better to return.  stop peroxide, start supportive measures as outlined in inst.  Problem # 2:  TOBACCO USER (ICD-305.1)  Encouraged smoking cessation and discussed poor skin healing from smoking.  Complete Medication List: 1)  Plavix 75 Mg Tabs (Clopidogrel bisulfate) .... Take one by mouth once a day 2)  Amlodipine Besylate 5 Mg Tabs (Amlodipine besylate) .... Take 1 tablet by mouth once a day 3)  Simvastatin 40 Mg Tabs (Simvastatin) .... Take 1 tablet by mouth once daily 4)  Celebrex 200 Mg Caps (Celecoxib) .... Take 1 by mouth once daily 5)  Diovan Hct  80-12.5 Mg Tabs (Valsartan-hydrochlorothiazide) .... Take one half tablet daily 6)  Alprazolam 0.5 Mg Tabs (Alprazolam) .... Take 1/2-1  by mouth two times a day as needed for nerves 7)  Aspirin 81 Mg Tbec (Aspirin) .... Take one by mouth once day 8)  Calcium 500/d 500-200 Mg-unit Tabs (Calcium carbonate-vitamin d) .... Take 1 by mouth once daily 9)  Doxycycline Hyclate 100 Mg Caps (Doxycycline hyclate) .... Take one twice a day x 10 days  Patient  Instructions: 1)  Elevate legs, less time on your feet over next few weeks. 2)  cut back on smoking to facilitate healing 3)  Keep treating with abx ointment and bandage. 4)  If redness or warmth or pain worsening, start antibiotics. 5)  If not improving as expected, or if any fevers/chills, or worsening redness, please return to be seen. 6)  Good to see you today, call clinic with questions Prescriptions: DOXYCYCLINE HYCLATE 100 MG CAPS (DOXYCYCLINE HYCLATE) take one twice a day x 10 days  #20 x 0   Entered and Authorized by:   Eustaquio Boyden  MD   Signed by:   Eustaquio Boyden  MD on 10/30/2010   Method used:   Print then Give to Patient   RxID:   [id number]    Orders Added: 1)  Est. Patient Level III [56213]    Current Allergies (reviewed today): ! WELLBUTRIN (BUPROPION HCL) ! LOSARTAN POTASSIUM-HCTZ (LOSARTAN POTASSIUM-HCTZ) SULFA TUSSIONEX PENNKINETIC ER (HYDROCOD POLST-CHLORPHEN POLST) SERTRALINE HCL (SERTRALINE HCL)

## 2011-01-09 NOTE — Progress Notes (Signed)
Summary: swelling in feet and ankles  Phone Note Call from Patient Call back at Home Phone [phone]   Caller: Patient Summary of Call: Patient has been having a little swelling in legs and ankles that she is concerned about. She started the Losartan on  May 20th and last weekend is when she noticed the swelling in legs and ankles. She wants to know if the Losartan could be the cause. She wants to know if she needs to try something else. Uses [facility].  Initial call taken by: Melody Comas,  May 05, 2010 1:15 PM  Follow-up for Phone Call        Please let her know the blood pressure med has a fluid pill in it so I doubt it is the problem  If not having SOB, have her wait it out a bit and call back if having persistent problems over the next couple of weeks Follow-up by: Cindee Salt MD,  May 05, 2010 1:23 PM  Additional Follow-up for Phone Call Additional follow up Details #1::        Patient Advised.  Additional Follow-up by: Delilah Shan CMA [facility]ull),  May 05, 2010 2:42 PM

## 2011-01-09 NOTE — Assessment & Plan Note (Signed)
Summary: 3 MONTH FOLLOW UP/RBH R/S FROM 3/9   Vital Signs:  Patient profile:   77 year old female Weight:      126 pounds Temp:     98.2 degrees F oral BP sitting:   142 / 68  (left arm) Cuff size:   regular  Vitals Entered By: Mervin Hack CMA Duncan Dull) (February 21, 2010 11:28 AM) CC: 3 month follow-up   History of Present Illness: Reviewed labs Dr Hazle Coca office called her and told her to increase simvastatin to 20mg  [facility] was already on that much and didn't want more she went to [facility] for him in February and LDL back down to 72---without any med change  Still with stress husband totally car--not his fault sister in law with dementia--- nnow at [employer]  Husband is doing okay Has started to flare up at times but calmed down when she called him on it Still feels committed to the marriage Husband and son have reconciled also  No chest pain No SOB No sig edema  Allergies: 1)  ! Wellbutrin (Bupropion Hcl) 2)  Sulfa 3)  Tussionex Pennkinetic Er (Chlorpheniramine-Hydrocodone) 4)  Sertraline Hcl (Sertraline Hcl)  Past History:  Past medical, surgical, family and social histories (including risk factors) reviewed for relevance to current acute and chronic problems.  Past Medical History: Reviewed history from 02/24/2008 and no changes required. Anxiety Hyperlipidemia Hypertension Osteopenia Allergic rhinitis ASCVD------------------------------------------------------Dr Allyson Sabal GERD/hiatal hernia  Past Surgical History: Reviewed history from 08/12/2007 and no changes required. Left CEA 07/05 Stent- abd aorta 09/05 Left breast benign tumor 1960's Cataract left then right 07/07--08/07 Cardiolite negative 05/05  Family History: Reviewed history from 08/12/2007 and no changes required. Father: Died at age 41, CVA's, ? Parkinson's Mother: Died, ? CAD, CVA, died after hip fx Siblings: One brother living with diabetes, one sister deceased with lung cancer Not  sure if CAD in family HBP in  Mom DM on Mother's side, brother No breast or colon cancer  Social History: Reviewed history from 08/12/2007 and no changes required.  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   Neck:  supple, no masses, no thyromegaly, and no cervical lymphadenopathy.   Lungs:  normal respiratory effort and normal breath sounds.   Heart:  normal rate, regular rhythm, no murmur, and no gallop.   Abdomen:  soft and non-tender.   Extremities:  no edema Psych:  normally interactive, good eye contact, not anxious appearing, and not depressed appearing.     Impression & Recommendations:  Problem # 1:  ANXIETY, SITUATIONAL (ICD-308.3) Assessment Improved much better at home Husband is better and she is empowered  Problem # 2:  HYPERTENSION (ICD-401.9) Assessment: Unchanged changing diovan to losartan--should save her $800 per year  The following medications were removed from the medication list:    Diovan Hct 80-12.5 Mg Tabs (Valsartan-hydrochlorothiazide) .Marland Kitchen... Take 1/2 by mouth once a day Her updated medication list for this problem includes:    Amlodipine Besylate 5 Mg Tabs (Amlodipine besylate) .Marland Kitchen... Take 1 tablet by mouth once a day    Losartan Potassium-hctz 50-12.5 Mg Tabs (Losartan potassium-hctz) .Marland Kitchen... 1 tab daily  BP today: 142/68  Prior BP: 150/70 (11/14/2009)  Labs Reviewed: K+: 4.8 (11/14/2009) Creat: : 0.8 (11/14/2009)   Chol: 180 (11/14/2009)   HDL: 39.40 (11/14/2009)   LDL: 105 (11/14/2009)   TG: 177.0 (11/14/2009)  Problem # 3:  ANXIETY (ICD-300.00) Assessment: Improved will change to lorazepam--should last  all night instead of having middle of the night awakening  The following medications were removed from the medication list:    Alprazolam 0.5 Mg Tabs (Alprazolam) .Marland Kitchen... Take 1/2-1  by mouth two times a day as needed for nerves Her updated medication list for this problem includes:    Lorazepam 0.5 Mg Tabs (Lorazepam) .Marland Kitchen... 1 tab by mouth two times a day as needed for nerves or sleep problems  Problem # 4:  HYPERLIPIDEMIA (ICD-272.4) last LDL back to 72 without dose change elevation may have been due to stress and eating changes when she had all that up heaval  Her updated medication list for this problem includes:    Simvastatin 40 Mg Tabs (Simvastatin) .Marland Kitchen... Take 1 tablet by mouth once daily  Labs Reviewed: SGOT: 21 (11/14/2009)   SGPT: 16 (11/14/2009)   HDL:39.40 (11/14/2009), 37.50 (03/28/2009)  LDL:105 (11/14/2009), 68 (47/82/9562)  Chol:180 (11/14/2009), 141 (03/28/2009)  Trig:177.0 (11/14/2009), 177.0 (03/28/2009)  Complete Medication List: 1)  Plavix 75 Mg Tabs (Clopidogrel bisulfate) .... Take one by mouth once a day 2)  Aspirin 81 Mg Tbec (Aspirin) .... Take one by mouth once day 3)  Amlodipine Besylate 5 Mg Tabs (Amlodipine besylate) .... Take 1 tablet by mouth once a day 4)  Simvastatin 40 Mg Tabs (Simvastatin) .... Take 1 tablet by mouth once daily 5)  Calcium 500/d 500-200 Mg-unit Tabs (Calcium carbonate-vitamin d) .... Take 1 by mouth once daily 6)  Celebrex 200 Mg Caps (Celecoxib) .... Take 1 by mouth once daily 7)  Fluticasone Propionate 50 Mcg/act Susp (Fluticasone propionate) .... 2 sprays each nostril daily for allergy congestion 8)  Zyrtec-d Allergy & Congestion 5-120 Mg Xr12h-tab (Cetirizine-pseudoephedrine) .... As needed 9)  L-lysine 500 Mg Tabs (Lysine) .... Once daily 10)  Lorazepam 0.5 Mg Tabs (Lorazepam) .Marland Kitchen.. 1 tab by mouth two times a day as needed for nerves or sleep problems 11)  Losartan Potassium-hctz 50-12.5 Mg Tabs (Losartan potassium-hctz) .Marland Kitchen.. 1 tab  daily  Patient Instructions: 1)  Please schedule a follow-up appointment in 6 months .  Prescriptions: LOSARTAN POTASSIUM-HCTZ 50-12.5 MG TABS (LOSARTAN POTASSIUM-HCTZ) 1 tab daily  #30 x 11   Entered and Authorized by:   Cindee Salt MD   Signed by:   Cindee Salt MD on 02/21/2010   Method used:   Print then Give to Patient   RxID:   [id number] LORAZEPAM 0.5 MG TABS (LORAZEPAM) 1 tab by mouth two times a day as needed for nerves or sleep problems  #60 x 1   Entered and Authorized by:   Cindee Salt MD   Signed by:   Cindee Salt MD on 02/21/2010   Method used:   Print then Give to Patient   RxID:   [id number]   Current Allergies (reviewed today): ! WELLBUTRIN (BUPROPION HCL) SULFA TUSSIONEX PENNKINETIC ER (CHLORPHENIRAMINE-HYDROCODONE) SERTRALINE HCL (SERTRALINE HCL)

## 2011-01-09 NOTE — Assessment & Plan Note (Signed)
Summary: ROA FOR 6 MONTH FOLLOW-UP/JRR   Vital Signs:  Patient profile:   77 year old female Weight:      124 pounds Temp:     98.2 degrees F oral Pulse rate:   72 / minute Pulse rhythm:   regular BP sitting:   148 / 70  (left arm) Cuff size:   regular  Vitals Entered By: Mervin Hack CMA Duncan Dull) (August 15, 2010 11:31 AM) CC: 6 month follow-up   History of Present Illness: Did get over the shingles doing fine now  Things are still okay with her husband No recent fits has needed Rx for kidney infeciton  Her BP has been up some 191/89 with HR of 100 one day took entire diovan after that Settled down and is back ot 1/2 Dr Allyson Sabal wants it fairly low When her BP was down closer to 120/70 she felt dizzy and sluggish has upcoming appt with him  Occ gets late afternoon RUQ pain--"like I'm coming in 2" Not sharp but it is just sore appetite is okay No post prandial symptoms Chronic constipation--no recent change----uses suppostiories as needed   Allergies: 1)  ! Wellbutrin (Bupropion Hcl) 2)  ! Losartan Potassium-Hctz (Losartan Potassium-Hctz) 3)  Sulfa 4)  Tussionex Pennkinetic Er (Hydrocod Polst-Chlorphen Polst) 5)  Sertraline Hcl (Sertraline Hcl)  Past History:  Past medical, surgical, family and social histories (including risk factors) reviewed for relevance to current acute and chronic problems.  Past Medical History: Reviewed history from 02/24/2008 and no changes required. Anxiety Hyperlipidemia Hypertension Osteopenia Allergic rhinitis ASCVD------------------------------------------------------Dr Allyson Sabal GERD/hiatal hernia  Past Surgical History: Reviewed history from 08/12/2007 and no changes required. Left CEA 07/05 Stent- abd aorta 09/05 Left breast benign tumor 1960's Cataract left then right 07/07--08/07 Cardiolite negative 05/05  Family History: Reviewed history from 08/12/2007 and no changes required. Father: Died at age 53, CVA's, ?  Parkinson's Mother: Died, ? CAD, CVA, died after hip fx Siblings: One brother living with diabetes, one sister deceased with lung cancer Not sure if CAD in family HBP in  Mom DM on Mother's side, brother No breast or colon cancer  Social History: Reviewed history from 08/12/2007 and no changes required.  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   Neck:  supple, no masses, no thyromegaly, no carotid bruits, and no cervical lymphadenopathy.   Lungs:  normal respiratory effort, no intercostal retractions, no accessory muscle use, and normal breath sounds.   Heart:  normal rate, regular rhythm, no murmur, and no gallop.   Abdomen:  soft, non-tender, normal bowel sounds, and no masses.   Extremities:  no edema Psych:  normally interactive, good eye contact, not depressed appearing, and slightly anxious.     Impression & Recommendations:  Problem # 1:  ABDOMINAL PAIN, RIGHT UPPER QUADRANT (ICD-789.01) Assessment New doesn't appear to be gall bladder related ??stress ??musculoskeletal  Problem # 2:  HYPERTENSION (ICD-401.9) Assessment: Unchanged  fair control will just check labs no changes for now  Her updated medication list for this problem includes:    Amlodipine Besylate 5 Mg Tabs (Amlodipine besylate) .Marland Kitchen... Take 1 tablet by mouth once a day    Diovan Hct 80-12.5 Mg Tabs (Valsartan-hydrochlorothiazide) .Marland Kitchen... Take  one half tablet daily  BP today: 148/70 Prior BP: 160/80 (06/01/2010)  Labs Reviewed: K+: 4.8 (11/14/2009) Creat: : 0.8 (11/14/2009)   Chol: 180 (11/14/2009)   HDL: 39.40 (11/14/2009)   LDL: 105 (11/14/2009)   TG: 177.0 (11/14/2009)  Orders: TLB-Renal Function Panel  (80069-RENAL) TLB-CBC Platelet - w/Differential (85025-CBCD) TLB-TSH (Thyroid Stimulating Hormone) (84443-TSH) Venipuncture (95621)  Problem # 3:  HYPERLIPIDEMIA (ICD-272.4) Assessment: Unchanged  due for labs  Her updated medication list for this problem includes:    Simvastatin 40 Mg Tabs (Simvastatin) .Marland Kitchen... Take 1 tablet by mouth once daily  Labs Reviewed: SGOT: 21 (11/14/2009)   SGPT: 16 (11/14/2009)   HDL:39.40 (11/14/2009), 37.50 (03/28/2009)  LDL:105 (11/14/2009), 68 (30/86/5784)  Chol:180 (11/14/2009), 141 (03/28/2009)  Trig:177.0 (11/14/2009), 177.0 (03/28/2009)  Orders: TLB-Lipid Panel (80061-LIPID) TLB-Hepatic/Liver Function Pnl (80076-HEPATIC)  Problem # 4:  ANXIETY (ICD-300.00) Assessment: Unchanged still with stress from husband but generally stable  Her updated medication list for this problem includes:    Alprazolam 0.5 Mg Tabs (Alprazolam) .Marland Kitchen... Take 1/2-1  by mouth two times a day as needed for nerves  Complete Medication List: 1)  Plavix 75 Mg Tabs (Clopidogrel bisulfate) .... Take one by mouth once a day 2)  Amlodipine Besylate 5 Mg Tabs (Amlodipine besylate) .... Take 1 tablet by mouth once a day 3)  Simvastatin 40 Mg Tabs (Simvastatin) .... Take 1 tablet by mouth once daily 4)  Celebrex 200 Mg Caps (Celecoxib) .... Take 1 by mouth once daily 5)  Diovan Hct 80-12.5 Mg Tabs (Valsartan-hydrochlorothiazide) .... Take one half tablet daily 6)  Alprazolam 0.5 Mg Tabs (Alprazolam) .... Take 1/2-1  by mouth two times a day as needed for nerves 7)  Aspirin 81 Mg Tbec (Aspirin) .... Take one by mouth once day 8)  Calcium 500/d 500-200 Mg-unit Tabs (Calcium carbonate-vitamin d) .... Take 1 by mouth once daily 9)  Hydrocodone-acetaminophen 5-325 Mg Tabs (Hydrocodone-acetaminophen) .Marland Kitchen.. 1 tab by mouth three times a day as needed for severe shingles pain  Patient Instructions: 1)  Please schedule a follow-up appointment in 6 months .   Current Allergies (reviewed  today): ! WELLBUTRIN (BUPROPION HCL) ! LOSARTAN POTASSIUM-HCTZ (LOSARTAN POTASSIUM-HCTZ) SULFA TUSSIONEX PENNKINETIC ER (HYDROCOD POLST-CHLORPHEN POLST) SERTRALINE HCL (SERTRALINE HCL)

## 2011-01-09 NOTE — Progress Notes (Signed)
Summary: prior Berkley Harvey is needed for diovan  Phone Note From Pharmacy   Caller: [facility] [employer] of Call: Prior Berkley Harvey is needed for diovan, form is on your desk. Initial call taken by: Lowella Petties CMA,  May 29, 2010 3:36 PM  Follow-up for Phone Call        form done Follow-up by: Cindee Salt MD,  May 29, 2010 4:41 PM  Additional Follow-up for Phone Call Additional follow up Details #1::        form faxed back to [facility] Additional Follow-up by: Mervin Hack CMA Duncan Dull),  May 29, 2010 4:58 PM     Appended Document: prior Berkley Harvey is needed for diovan Pt wants to cancel the  prior auth on diovan, she said she will pay for this out of pocket.

## 2011-01-09 NOTE — Progress Notes (Signed)
Summary: went back to diovan  Phone Note Call from Patient Call back at Home Phone [phone]   Caller: Patient Call For: Cindee Salt MD Summary of Call: Pt wants you to know that she started back on diovan and the swelling she had in her feet and legs while taking losartan is now gone.   She wants to continue on with diovan. Initial call taken by: Lowella Petties CMA,  May 29, 2010 12:26 PM  Follow-up for Phone Call        okay to fill diovan for 1 year if needed  add losartan as allergy Follow-up by: Cindee Salt MD,  May 29, 2010 1:13 PM  Additional Follow-up for Phone Call Additional follow up Details #1::        left message on machine that it's ok to start Diovan, advised pt to call for any refills Additional Follow-up by: Mervin Hack CMA Duncan Dull),  May 29, 2010 3:47 PM   New Allergies: ! LOSARTAN POTASSIUM-HCTZ (LOSARTAN POTASSIUM-HCTZ) New Allergies: ! LOSARTAN POTASSIUM-HCTZ (LOSARTAN POTASSIUM-HCTZ) Prior Medications: PLAVIX 75 MG  TABS (CLOPIDOGREL BISULFATE) Take one by mouth once a day ASPIRIN 81 MG  TBEC (ASPIRIN) Take one by mouth once day AMLODIPINE BESYLATE 5 MG  TABS (AMLODIPINE BESYLATE) Take 1 tablet by mouth once a day SIMVASTATIN 40 MG TABS (SIMVASTATIN) take 1 tablet by mouth once daily CALCIUM 500/D 500-200 MG-UNIT TABS (CALCIUM CARBONATE-VITAMIN D) take 1 by mouth once daily CELEBREX 200 MG CAPS (CELECOXIB) take 1 by mouth once daily FLUTICASONE PROPIONATE 50 MCG/ACT SUSP (FLUTICASONE PROPIONATE) 2 sprays each nostril daily for allergy congestion ZYRTEC-D ALLERGY & CONGESTION 5-120 MG XR12H-TAB (CETIRIZINE-PSEUDOEPHEDRINE) as needed L-LYSINE 500 MG TABS (LYSINE) once daily LOSARTAN POTASSIUM-HCTZ 50-12.5 MG TABS (LOSARTAN POTASSIUM-HCTZ) 1 tab daily ALPRAZOLAM 0.5 MG TABS (ALPRAZOLAM) Take 1/2-1  by mouth two times a day as needed for nerves Current Allergies: ! WELLBUTRIN (BUPROPION HCL) ! LOSARTAN POTASSIUM-HCTZ (LOSARTAN  POTASSIUM-HCTZ) SULFA TUSSIONEX PENNKINETIC ER (CHLORPHENIRAMINE-HYDROCODONE) SERTRALINE HCL (SERTRALINE HCL)

## 2011-01-09 NOTE — Progress Notes (Signed)
Summary: alprazolam   Phone Note Refill Request Message from:  Fax from Pharmacy on September 11, 2010 1:49 PM  Refills Requested: Medication #1:  ALPRAZOLAM 0.5 MG TABS Take 1/2-1  by mouth two times a day as needed for nerves   Last Refilled: 08/11/2010 Refill request from [facility]. [phone]. Form is on your desk.   Initial call taken by: Melody Comas,  September 11, 2010 1:49 PM  Follow-up for Phone Call        okay # 60 x 1 Follow-up by: Cindee Salt MD,  September 11, 2010 2:00 PM  Additional Follow-up for Phone Call Additional follow up Details #1::        Rx faxed to pharmacy Additional Follow-up by: DeShannon Smith CMA Duncan Dull),  September 11, 2010 2:22 PM    Prescriptions: ALPRAZOLAM 0.5 MG TABS (ALPRAZOLAM) Take 1/2-1  by mouth two times a day as needed for nerves  #60 x 1   Entered by:   Mervin Hack CMA (AAMA)   Authorized by:   Cindee Salt MD   Signed by:   Mervin Hack CMA (AAMA) on 09/11/2010   Method used:   Handwritten   RxID:   [id number]

## 2011-01-09 NOTE — Progress Notes (Signed)
Summary: DIOVAN   Phone Note Outgoing Call   Call placed by: Mervin Hack CMA Duncan Dull),  May 31, 2010 2:30 PM Call placed to: Patient Summary of Call: per Laurie's append on 05/30/2010, pt will pay out of pocket for this medication. Initial call taken by: Mervin Hack CMA Duncan Dull),  May 31, 2010 2:31 PM  Follow-up for Phone Call        okay  she should have an Rx--if not update it for a year Follow-up by: Cindee Salt MD,  May 31, 2010 5:56 PM  Additional Follow-up for Phone Call Additional follow up Details #1::        patient does have rx, was sent in on 05/30/2010 by Jacki Cones to [facility]. Additional Follow-up by: Mervin Hack CMA Duncan Dull),  June 01, 2010 8:25 AM

## 2011-01-09 NOTE — Progress Notes (Signed)
Summary: Celebrex  Phone Note Refill Request Message from:  Fax from Pharmacy on May 25, 2010 4:57 PM  Refills Requested: Medication #1:  CELEBREX 200 MG CAPS take 1 by mouth once daily [facility]  Phone:   [phone]   Method Requested: Electronic Initial call taken by: Delilah Shan CMA Duncan Dull),  May 25, 2010 4:57 PM  Follow-up for Phone Call        okay #30 x 5 Follow-up by: Cindee Salt MD,  May 25, 2010 5:32 PM    Prescriptions: CELEBREX 200 MG CAPS (CELECOXIB) take 1 by mouth once daily  #30 x 5   Entered by:   Delilah Shan CMA (AAMA)   Authorized by:   Cindee Salt MD   Signed by:   Delilah Shan CMA (AAMA) on 05/25/2010   Method used:   Electronically to        [facility]* (retail)       [address] [place], [place]  [address]       Ph: [phone]       Fax: [phone]   RxID:   [id number]

## 2011-01-09 NOTE — Assessment & Plan Note (Signed)
Summary: FLU SHOT/CLE  Nurse Visit   Allergies: 1)  ! Wellbutrin (Bupropion Hcl) 2)  ! Losartan Potassium-Hctz (Losartan Potassium-Hctz) 3)  Sulfa 4)  Tussionex Pennkinetic Er (Hydrocod Polst-Chlorphen Polst) 5)  Sertraline Hcl (Sertraline Hcl)  Immunizations Administered:  Influenza Vaccine # 1:    Vaccine Type: Fluvax MCR    Site: left deltoid    Mfr: Sanofi Pasteur    Dose: 0.5 ml    Route: IM    Given by: Mervin Hack CMA (AAMA)    Exp. Date: 06/09/2011    Lot #: [id number]    VIS given: 07/04/10 version given September 27, 2010.  Flu Vaccine Consent Questions:    Do you have a history of severe allergic reactions to this vaccine? no    Any prior history of allergic reactions to egg and/or gelatin? no    Do you have a sensitivity to the preservative Thimersol? no    Do you have a past history of Guillan-Barre Syndrome? no    Do you currently have an acute febrile illness? no    Have you ever had a severe reaction to latex? no    Vaccine information given and explained to patient? yes    Are you currently pregnant? no  Orders Added: 1)  Flu Vaccine 61yrs + MEDICARE PATIENTS [Q2039] 2)  Administration Flu vaccine - MCR [G0008]

## 2011-01-09 NOTE — Miscellaneous (Signed)
Summary: med list update- diovan  Clinical Lists Changes  Medications: Changed medication from LOSARTAN POTASSIUM-HCTZ 50-12.5 MG TABS (LOSARTAN POTASSIUM-HCTZ) 1 tab daily to DIOVAN HCT 80-12.5 MG TABS (VALSARTAN-HYDROCHLOROTHIAZIDE) take one half tablet daily     Prior Medications: PLAVIX 75 MG  TABS (CLOPIDOGREL BISULFATE) Take one by mouth once a day ASPIRIN 81 MG  TBEC (ASPIRIN) Take one by mouth once day AMLODIPINE BESYLATE 5 MG  TABS (AMLODIPINE BESYLATE) Take 1 tablet by mouth once a day SIMVASTATIN 40 MG TABS (SIMVASTATIN) take 1 tablet by mouth once daily CALCIUM 500/D 500-200 MG-UNIT TABS (CALCIUM CARBONATE-VITAMIN D) take 1 by mouth once daily CELEBREX 200 MG CAPS (CELECOXIB) take 1 by mouth once daily FLUTICASONE PROPIONATE 50 MCG/ACT SUSP (FLUTICASONE PROPIONATE) 2 sprays each nostril daily for allergy congestion ZYRTEC-D ALLERGY & CONGESTION 5-120 MG XR12H-TAB (CETIRIZINE-PSEUDOEPHEDRINE) as needed L-LYSINE 500 MG TABS (LYSINE) once daily ALPRAZOLAM 0.5 MG TABS (ALPRAZOLAM) Take 1/2-1  by mouth two times a day as needed for nerves Current Allergies: ! WELLBUTRIN (BUPROPION HCL) ! LOSARTAN POTASSIUM-HCTZ (LOSARTAN POTASSIUM-HCTZ) SULFA TUSSIONEX PENNKINETIC ER (CHLORPHENIRAMINE-HYDROCODONE) SERTRALINE HCL (SERTRALINE HCL)

## 2011-01-11 NOTE — Progress Notes (Signed)
Summary: ALPRAZOLAM  Phone Note Refill Request Message from:  [facility] [phone] on December 12, 2010 12:55 PM  Refills Requested: Medication #1:  ALPRAZOLAM 0.5 MG TABS Take 1/2-1  by mouth two times a day as needed for nerves   Last Refilled: 10/20/2010 [facility] PATIENT   Method Requested: Telephone to Pharmacy Initial call taken by: Mervin Hack CMA Duncan Dull),  December 12, 2010 12:55 PM  Follow-up for Phone Call        Rx called to pharmacy Follow-up by: Linde Gillis CMA Duncan Dull),  December 12, 2010 3:09 PM    Prescriptions: ALPRAZOLAM 0.5 MG TABS (ALPRAZOLAM) Take 1/2-1  by mouth two times a day as needed for nerves  #60 x 0   Entered and Authorized by:   Kerby Nora MD   Signed by:   Kerby Nora MD on 12/12/2010   Method used:   Telephoned to ...       [facility]* (retail)       [address] [place], [place]  [address]       Ph: [phone]       Fax: [phone]   RxID:   [id number]

## 2011-01-11 NOTE — Letter (Signed)
Summary: [facility]   Imported By: Maryln Gottron 12/14/2010 11:17:05  _____________________________________________________________________  External Attachment:    Type:   Image     Comment:   External Document  Appended Document: [facility] left foot wound is healing

## 2011-01-11 NOTE — Progress Notes (Signed)
Summary: celebrex  Phone Note Refill Request Message from:  Fax from Pharmacy on January 02, 2011 5:05 PM  Refills Requested: Medication #1:  CELEBREX 200 MG CAPS take 1 by mouth once daily   Last Refilled: 05/25/2010 Refill request from [facility]. [phone]. Fax is on your desk.   Initial call taken by: Melody Comas,  January 02, 2011 5:06 PM  Follow-up for Phone Call        Rx completed in Dr. Tiajuana Amass Follow-up by: Cindee Salt MD,  January 03, 2011 7:38 AM    New/Updated Medications: CELEBREX 200 MG CAPS (CELECOXIB) take 1 by mouth once daily Prescriptions: CELEBREX 200 MG CAPS (CELECOXIB) take 1 by mouth once daily  #30 x 11   Entered and Authorized by:   Cindee Salt MD   Signed by:   Cindee Salt MD on 01/03/2011   Method used:   Electronically to        [facility]* (retail)       [address] [place], [place]  [address]       Ph: [phone]       Fax: [phone]   RxID:   [id number]

## 2011-01-11 NOTE — Medication Information (Signed)
Summary: [employer]   Imported By: Harlon Flor 12/06/2010 08:12:37  _____________________________________________________________________  External Attachment:    Type:   Image     Comment:   External Document

## 2011-01-18 ENCOUNTER — Telehealth: Payer: Self-pay | Admitting: Internal Medicine

## 2011-01-25 NOTE — Progress Notes (Signed)
Summary: alprazolam  Phone Note Refill Request Message from:  Fax from Pharmacy on January 18, 2011 2:59 PM  Refills Requested: Medication #1:  ALPRAZOLAM 0.5 MG TABS Take 1/2-1  by mouth two times a day as needed for nerves   Last Refilled: 12/12/2010 Refill request from [facility]. [phone]. Fax is on your desk.   Initial call taken by: Melody Comas,  January 18, 2011 3:00 PM  Follow-up for Phone Call        okay #60 x 0 Follow-up by: Cindee Salt MD,  January 19, 2011 7:54 AM  Additional Follow-up for Phone Call Additional follow up Details #1::        Rx faxed to pharmacy Additional Follow-up by: DeShannon Smith CMA Duncan Dull),  January 19, 2011 9:37 AM    Prescriptions: ALPRAZOLAM 0.5 MG TABS (ALPRAZOLAM) Take 1/2-1  by mouth two times a day as needed for nerves  #60 x 0   Entered by:   Mervin Hack CMA (AAMA)   Authorized by:   Cindee Salt MD   Signed by:   Mervin Hack CMA (AAMA) on 01/19/2011   Method used:   Handwritten   RxID:   [id number]

## 2011-02-21 ENCOUNTER — Ambulatory Visit (INDEPENDENT_AMBULATORY_CARE_PROVIDER_SITE_OTHER): Payer: Medicare Other | Admitting: Internal Medicine

## 2011-02-21 ENCOUNTER — Encounter: Payer: Self-pay | Admitting: Internal Medicine

## 2011-02-21 DIAGNOSIS — I10 Essential (primary) hypertension: Secondary | ICD-10-CM

## 2011-02-21 DIAGNOSIS — K219 Gastro-esophageal reflux disease without esophagitis: Secondary | ICD-10-CM

## 2011-02-21 DIAGNOSIS — E785 Hyperlipidemia, unspecified: Secondary | ICD-10-CM

## 2011-02-21 DIAGNOSIS — F411 Generalized anxiety disorder: Secondary | ICD-10-CM

## 2011-02-27 NOTE — Assessment & Plan Note (Signed)
Summary: 6 MONTH FOLLOW R/S FROM 3=6=2012   Vital Signs:  Patient profile:   77 year old female Weight:      120 pounds Temp:     98.4 degrees F oral Pulse rate:   73 / minute Pulse rhythm:   regular BP sitting:   140 / 68  (left arm) Cuff size:   regular  Vitals Entered By: Mervin Hack CMA Duncan Dull) (February 21, 2011 10:39 AM) CC: follow-up   History of Present Illness: Brother is having problems Wife in facility with [facility] Lots of stress She has wound up being his main support as he has had some health issues also  Doing fine with husband no new concerns with this  Heart is fine recent echo was okay BP has been okay--she checks it occ No chest pain  No SOB No sig edema--gone by AM always  Stomach fine No sig heartburn  Allergies: 1)  ! Wellbutrin (Bupropion Hcl) 2)  ! Losartan Potassium-Hctz (Losartan Potassium-Hctz) 3)  Sulfa 4)  Tussionex Pennkinetic Er (Hydrocod Polst-Chlorphen Polst) 5)  Sertraline Hcl (Sertraline Hcl)  Past History:  Past medical, surgical, family and social histories (including risk factors) reviewed for relevance to current acute and chronic problems.  Past Medical History: Reviewed history from 02/24/2008 and no changes required. Anxiety Hyperlipidemia Hypertension Osteopenia Allergic rhinitis ASCVD------------------------------------------------------Dr Allyson Sabal GERD/hiatal hernia  Past Surgical History: Reviewed history from 08/12/2007 and no changes required. Left CEA 07/05 Stent- abd aorta 09/05 Left breast benign tumor 1960's Cataract left then right 07/07--08/07 Cardiolite negative 05/05  Family History: Reviewed history from 08/12/2007 and no changes required. Father: Died at age 62, CVA's, ? Parkinson's Mother: Died, ? CAD, CVA, died after hip fx Siblings: One brother living with diabetes, one sister deceased with lung cancer Not sure if CAD in family HBP in  Mom DM on Mother's side, brother No breast  or colon cancer  Social History: Reviewed history from 08/12/2007 and no changes required.  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   Neck:  supple, no masses, no thyromegaly, no carotid bruits, and no cervical lymphadenopathy.   Lungs:  normal respiratory effort, no intercostal retractions, no accessory muscle use, and normal breath sounds.   Heart:  normal rate, regular rhythm, no murmur, and no gallop.   Extremities:  no edema Psych:  normally interactive, good eye contact, not depressed appearing, and slightly anxious.     Impression & Recommendations:  Problem # 1:  ANXIETY (ICD-300.00) Assessment Deteriorated worse again--stress with brother doing okay counselled about how to help him out  Her updated medication list for this problem includes:    Alprazolam 0.5 Mg Tabs (Alprazolam) .Marland Kitchen... Take 1/2-1  by mouth two times a day as needed for nerves  Problem # 2:  HYPERTENSION (ICD-401.9) Assessment: Unchanged good control no changes needed  Her updated medication list for this problem includes:    Amlodipine Besylate 5 Mg Tabs (Amlodipine besylate) .Marland Kitchen... Take 1 tablet by mouth once a day    Diovan Hct 80-12.5 Mg Tabs (Valsartan-hydrochlorothiazide) .Marland Kitchen... Take one half tablet daily  BP today: 140/68 Prior BP: 160/90 (10/30/2010)  Labs Reviewed: K+: 3.8 (08/15/2010) Creat: :  1.0 (08/15/2010)   Chol: 138 (08/15/2010)   HDL: 34.90 (08/15/2010)   LDL: 105 (11/14/2009)   TG: 210.0 (08/15/2010)  Problem # 3:  HYPERLIPIDEMIA (ICD-272.4) Assessment: Unchanged doing fine with the med no changes needed labs next time  Her updated medication list for this  problem includes:    Simvastatin 40 Mg Tabs (Simvastatin) .Marland Kitchen... Take 1 tablet by mouth once daily  Labs Reviewed: SGOT: 23 (08/15/2010)   SGPT: 15 (08/15/2010)   HDL:34.90 (08/15/2010), 39.40 (11/14/2009)  LDL:105 (11/14/2009), 68 (52/84/1324)  Chol:138 (08/15/2010), 180 (11/14/2009)  Trig:210.0 (08/15/2010), 177.0 (11/14/2009)  Problem # 4:  GERD (ICD-530.81) Assessment: Comment Only okay without meds  Complete Medication List: 1)  Plavix 75 Mg Tabs (Clopidogrel bisulfate) .... Take one by mouth once a day 2)  Amlodipine Besylate 5 Mg Tabs (Amlodipine besylate) .... Take 1 tablet by mouth once a day 3)  Simvastatin 40 Mg Tabs (Simvastatin) .... Take 1 tablet by mouth once daily 4)  Celebrex 200 Mg Caps (Celecoxib) .... Take 1 by mouth once daily 5)  Diovan Hct 80-12.5 Mg Tabs (Valsartan-hydrochlorothiazide) .... Take one half tablet daily 6)  Alprazolam 0.5 Mg Tabs (Alprazolam) .... Take 1/2-1  by mouth two times a day as needed for nerves 7)  Aspirin 81 Mg Tbec (Aspirin) .... Take one by mouth once day 8)  Calcium 500/d 500-200 Mg-unit Tabs (Calcium carbonate-vitamin d) .... Take 1 by mouth once daily  Patient Instructions: 1)  Please schedule a follow-up appointment in 6 months .    Orders Added: 1)  Est. Patient Level IV [40102]    Current Allergies (reviewed today): ! WELLBUTRIN (BUPROPION HCL) ! LOSARTAN POTASSIUM-HCTZ (LOSARTAN POTASSIUM-HCTZ) SULFA TUSSIONEX PENNKINETIC ER (HYDROCOD POLST-CHLORPHEN POLST) SERTRALINE HCL (SERTRALINE HCL)

## 2011-02-28 ENCOUNTER — Other Ambulatory Visit: Payer: Self-pay | Admitting: *Deleted

## 2011-02-28 MED ORDER — ALPRAZOLAM 0.5 MG PO TABS: 0.5000 mg | ORAL_TABLET | Freq: Every evening | ORAL | Status: AC | PRN

## 2011-02-28 NOTE — Telephone Encounter (Signed)
Rx called in 

## 2011-02-28 NOTE — Telephone Encounter (Signed)
Refill request from [facility]. [phone]

## 2011-02-28 NOTE — Telephone Encounter (Signed)
Pt of Dr. Karle Starch... If sent to me incorrectly please forward to [facility].  I don't believe he is out of town. And I am not in office today, so it is strange this is sent to me.  Otherwise if it was supposed to go to me...appears last OV in 02/2011, last refill of alprazolam in 01/2011... Okay for refill. Last refill was #60, 0 RF.Marland Kitchen Phone in please as # 60, 0 RF.Marland Kitchen

## 2011-02-28 NOTE — Telephone Encounter (Signed)
rx called to pharmacy 

## 2011-04-10 ENCOUNTER — Other Ambulatory Visit: Payer: Self-pay | Admitting: *Deleted

## 2011-04-10 NOTE — Telephone Encounter (Signed)
Faxed request from [facility], last filled 02/28/11.

## 2011-04-11 MED ORDER — ALPRAZOLAM 0.5 MG PO TABS: 0.5000 mg | ORAL_TABLET | Freq: Every evening | ORAL | Status: DC | PRN

## 2011-04-11 NOTE — Telephone Encounter (Signed)
duplicate

## 2011-04-11 NOTE — Telephone Encounter (Signed)
Okay #30 x 1 

## 2011-04-11 NOTE — Telephone Encounter (Signed)
Rx called in to pharmacy. 

## 2011-04-27 NOTE — Discharge Summary (Signed)
NAME:  Paige Ortiz, Paige Ortiz                          ACCOUNT NO.:  [id number]   MEDICAL RECORD NO.:  [id number]                   PATIENT TYPE:  INP   LOCATION:  6533                                 FACILITY:  [facility]   PHYSICIAN:  Nanetta Batty, M.D.                DATE OF BIRTH:  [birth year]   DATE OF ADMISSION:  08/21/2004  DATE OF DISCHARGE:  08/23/2004                                 DISCHARGE SUMMARY   DISCHARGE DIAGNOSES:  1.  Claudication with known peripheral vascular disease with severe      bilateral artery stenosis.  2.  PV angiogram with PTA and stent of the abdominal aorta and PTA and stent      of the bilateral CIA.  3.  Acute anemia secondary to acute bleed.  4.  Intraperitoneal hemorrhage with retroperitoneal bleed per CT scan with      loss of 4 g of hemoglobin.  5.  Hypotension secondary to bleed, now stable.   CONDITION ON DISCHARGE:  Stable.   DISCHARGE MEDICATIONS:  1.  Hold aspirin 81 mg until after you see Dr. Allyson Sabal.  2.  Plavix 75 mg daily.  3.  Caduet 5/10 mg one daily.  Hold for now until blood pressure at least      145-150/90, then begin Caduet.  4.  Hold Diovan/hydrochlorothiazide.  5.  Xanax 0.5 mg as needed as before.  6.  Nu-Iron 150 mg one b.i.d.   DISCHARGE INSTRUCTIONS:  1.  Call with any back pain or groin pain.  2.  No strenuous activity until you see Dr. Allyson Sabal.  3.  Low-fat, low-salt diet.  4.  Wash catheter site with soap and water.  Call with any bleeding,      swelling or drainage.  5.  You need to have lab work done Friday morning, CBC before you see Dr.      Allyson Sabal.  6.  Follow up with Dr. Allyson Sabal this Friday morning at 3 p.m.   HISTORY OF PRESENT ILLNESS:  A 77 year old female who was seen by Dr. Allyson Sabal  in the office August 15, 2004, for peripheral vascular disease and  intervention workup.  History of peripheral vascular disease with left  carotid endarterectomy July 2005 by Dr. Orson Slick, noted to have a 60% right  renal artery stenosis  and 95% bilateral iliac artery stenosis.  She has  bilateral hip claudication.  She was actually scheduled and in the holding  area last week for peripheral vascular disease intervention and had problems  with one of the rooms at [facility] and was rescheduled.   PAST MEDICAL HISTORY:  1.  Hypertension.  2.  Hyperlipidemia.  3.  Negative Cardiolite May 2005.  4.  Normal echocardiogram.  5.  Carotid Dopplers since we saw her with less than 50% narrowing at the      previous endarterectomy site.   PAST SURGICAL HISTORY:  Surgeries of remote  benign left breast tumor in the  60's.   OUTPATIENT MEDICATIONS:  1.  Plavix 75 mg.  2.  Caduet 5/10 mg daily.  3.  Xanax.  4.  Diovan HCT 80/12.5 daily.  5.  Aspirin daily.   ALLERGIES:  No known drug allergies.   SOCIAL HISTORY/FAMILY HISTORY/REVIEW OF SYSTEMS:  Please see H&P.   PHYSICAL EXAMINATION:  VITAL SIGNS:  Orthostatics, just prior to discharge,  lying 111/35, sitting 125/46, standing 124/44, after walking 146/49.  Pulse  82, respirations stable.  Room air oxygen saturation 94%.  Temperature 97.4.   LABORATORY DATA:  Prior to procedure, hemoglobin 14.2, hematocrit 41.3, WBC  9.8, platelets 288,000.  Postprocedure, or the night after procedure after  the patient's hemoglobin had dropped somewhat, hemoglobin dropped to 9.1 at  3:18 in the morning on September 13 and at 9 a.m. she was 10.3, hematocrit  28.9.  WBC 10.9.  The morning of August 23, 2004, hemoglobin was stable  at 10.2, hematocrit 28.9, WBC 9.3 and platelets 270.   Chemistry:  Sodium 135, potassium 4.0, glucose 113, BUN 6, creatinine 1.0.   PHYSICAL EXAMINATION:  RESPIRATORY:  Sinus rhythm.  Lungs clear.  HEART:  S1, S2, regular rate and rhythm.  EXTREMITIES:  Right groin mild ecchymosis.  No bruits.  No hematoma.  Pedal  pulses are present.  Please see Ms. Easley's assessment.   STUDIES:  CT of the abdomen and pelvis September 13, intraperitoneal  hemorrhage tracts  into the intraperitoneal soft tissue from the right groin  into the pelvic sidewall, extraperitoneal tissues and right retroperitoneal  space.   HOSPITAL COURSE:  Mrs. Robertshaw presented as an outpatient, underwent PV  angiogram with Dr. Allyson Sabal, tolerated the procedure well and underwent PTA and  stent to the abdominal aorta and to the bilateral CIA's.  That night, about  10 p.m., blood pressure dropped to 77/32, heart rate 84, respirations 14,  blood pressure medications were held.  The patient received a 500 cc bolus  of saline, and pressure came up.  Hemoglobin had dropped to 9.2 from 14.  That next morning it was stable at 10.  She did have a small ruptured  peritoneal hematoma.   The patient was monitored, blood pressure medications held and by the next  morning of August 23, 2004, she was stable.  She was nonorthostatic, and  was able to ambulate without further difficulties.  Color was quite good,  and she was discharged home.  She will follow up with Dr. Allyson Sabal on September  16.  Instructions were given for any discomfort at all she will call our  office and will have a hemoglobin checked on Friday prior to the visit as a  stat procedure.      Darcella Gasman. Valarie Merino                     Nanetta Batty, M.D.    LRI/MEDQ  D:  08/23/2004  T:  08/23/2004  Job:  [id number]   cc:   Evelene Croon  [address]  [place]  [place] [address]  Fax: [phone]   Darlin Priestly, M.D.  [phone] [address], [address]  [place]  [place] [address]  Fax: [phone]

## 2011-04-27 NOTE — Cardiovascular Report (Signed)
NAME:  Paige Ortiz, Paige Ortiz                          ACCOUNT NO.:  [id number]   MEDICAL RECORD NO.:  [id number]                   PATIENT TYPE:  OIB   LOCATION:  6533                                 FACILITY:  [facility]   PHYSICIAN:  Nanetta Batty, M.D.                DATE OF BIRTH:  1933/03/12   DATE OF PROCEDURE:  DATE OF DISCHARGE:                              CARDIAC CATHETERIZATION   PROCEDURE:  Bilateral iliac artery percutaneous transluminal angioplasty and  stent procedure.   Paige Ortiz is a 77 year old female with a history of PVOD.  She has had left  carotid endarterectomy by Dr. Marcy Panning on June 23, 2004.  She has  complained of bilateral lower extremity claudication.  Angiography performed  at the time of carotid angiography revealed a high-grade infra-renal  abdominal aortic stenosis and bilateral iliac disease.  A __________  performed on May 16 was nonischemic.  She presents now for abdominal aortic  and bilateral iliac artery PTA and stenting.   DESCRIPTION OF PROCEDURE:  Patient was brought to the 6th floor [place]  peripheral vascular angiographic suite in the postabsorptive.  She is  premedicated with 3 of Valium, IV Versed and Nubain.  Both groins were  prepped and shaved in the usual sterile fashion.  Xylocaine 1% was used for  local anesthesia.  A 27 French 30 cm long right-tipped Cordis sheath was  inserted in each common femoral artery using a standard Seldinger technique  after Xylocaine local anesthesia.  Isovue dye was used for the entirety of  the case.  Retrograde aortic pressure was monitored during the case.   Patient received a total of 4000 units of heparin intravenously.  The origin  of the right common iliac artery was predilated with a 60 Powerflex.  Angiography was performed with a short 5 French pigtail catheter in the PA  and lateral views.  Dr. Alanda Amass assisted during the case.   PT and stenting was performed of a distal abdominal aorta using  an 8 x 29  Genesis on Opta, post dilated with a 9 x 2 Powerflex, resulting in reduction  of 80%, distal abdominal aortic stenosis to 0% residual.  Following this,  kissing PTA and stenting was performed to the iliac bifurcation using a 7 x  39 on the left and a 7 x 29 on the right.  They were simultaneously inflated  with precise angiographic and fluoroscopic positioning at the iliac  bifurcation at 9 or 10 atmospheres, resulting in reduction of bilateral  iliac disease (95% right and 85% left with a 0% residual with excellent  flow).  At the completion, angiogram was performed using a pigtail catheter.   IMPRESSION:  Successful distal abdominal aortic and bilateral iliac artery  percutaneous transluminal angioplasty and stenting.  Patient tolerated the  procedure well.  ACT has measured greater than 200.  Both the long sheaths  were  exchanged over  a wire for a short 7 [place] sheath.  They will be removed  once the ACT falls below 150.  Patient will be treated with aspirin, Plavix,  hydrated and discharged home in the morning.  She will get __________ in the  office, at which time she will see me back in followup.  She left the lab in  stable condition.                                               Nanetta Batty, M.D.    Cordelia Pen  D:  08/21/2004  T:  08/21/2004  Job:  [id number]   cc:   Darlin Priestly, M.D.  [address], [address]  [place]  [place] [address]  Fax: [phone]   Evelene Croon  [address]  [place]  [place] [address]  Fax: [phone]   [facility]

## 2011-04-27 NOTE — Cardiovascular Report (Signed)
NAME:  Paige Ortiz, Paige Ortiz                          ACCOUNT NO.:  [id number]   MEDICAL RECORD NO.:  [id number]                   PATIENT TYPE:  OIB   LOCATION:  2899                                 FACILITY:  [facility]   PHYSICIAN:  Nanetta Batty, M.D.                DATE OF BIRTH:  1933-01-30   DATE OF PROCEDURE:  05/15/2004  DATE OF DISCHARGE:  05/15/2004                              CARDIAC CATHETERIZATION   PROCEDURE:  Cerebral angiogram.   CARDIOLOGIST:  Nanetta Batty, M.D.   INDICATIONS:  This is a 77 year old female patient of Dr. Jenne Ortiz and Dr.  Glenis Ortiz.  She has a history of hypertension, hyperlipidemia, COPD,  claudication and cerebrovascular disease.  She had carotid Doppler's  performed in our office May 10, 2004, revealing high grade left ICA  stenosis.  She is neurologically asymptomatic.  She is on Plavix.  She  presents now for angiography.   DESCRIPTION OF PROCEDURE:  The patient was brought to the sixth floor [facility] Peripheral Vascular Angiographic Suite in the post absorptive state.  She was premedicated with p.o. Valium.  Her left groin was prepped and  shaved in the usual sterile fashion.  1% Xylocaine was used for local  anesthesia.  A 5 French sheath was inserted into the right femoral artery  using standard Seldinger technique.  A 5 [place] tennis racket catheter was  used for arch angiography and distal abdominal aortography.  A 5 [place] JV1  catheter was used for selective right vertebral, right carotid, left  vertebral carotid angiography.  Visipaque dye was used for the entirety of  the case.  Retrograde aortic pressure was measured.   The sheath was removed, and pressure was held on the groin to achieve  hemostasis.  The patient left the lab in stable condition.  She will be  hydrated and discharged home later today.  She will see me back in the  office in approximately two weeks.   ANGIOGRAPHIC RESULTS:  1. Arch aortogram:  The patient had a type 1  arch.  2. Right vertebral:  This is a nondominant vessel with normal intracranial     posterior circulation.  3. Right carotid:  Selectively visualized revealing normal internal and     external carotid, intracranial and extracranial circulation.  4. Left carotid:  This was selectively visualized with a 95% proximal left     ICA stenosis.  The extra and intracranial carotid circulation were     normal.  5. Left vertebral.  This is a dominant vessel with normal intracranial     anatomy.  6. Distal abdominal aortography.  Performed using 20 mL of Visipaque dye, 20     mL per mL.  There appeared to be 50-70% ostial right renal artery     stenosis.  There is a 90% fairly simple apple core infrarenal abdominal     aortic stenosis just about the iliac bifurcation.  There was 60%     segmental stenosis below this into the bifurcation.  The right common     iliac artery had 95% ostial stenosis and the left had 50-70% segmental     stenosis with what appeared to be intraluminal filling defects.   IMPRESSION:  Paige Ortiz has severe left ICA stenosis that is neurologically  asymptomatic.  Ideally, she is a good candidate for carotid stenting,  however, she is not high risk and thus probably will need to undergo carotid  endarterectomy.  She also is a candidate for aortoiliac  _________ and stenting for early symptoms of claudication.                                               Nanetta Batty, M.D.    Paige Ortiz  D:  05/15/2004  T:  05/16/2004  Job:  [id number]   cc:   [facility]   Darlin Priestly, M.D.  [address]  [place]  [place] [address]  Fax: [phone]   Evelene Croon  [address]  [place]  [place] [address]  Fax: [phone]

## 2011-04-27 NOTE — Op Note (Signed)
NAME:  Paige Ortiz, Paige Ortiz NO.:  [id number]   MEDICAL RECORD NO.:  [id number]                   PATIENT TYPE:  OIB   LOCATION:  3313                                 FACILITY:  [facility]   PHYSICIAN:  Lorre Munroe., M.D.            DATE OF BIRTH:  06-10-1933   DATE OF PROCEDURE:  06/23/2004  DATE OF DISCHARGE:                                 OPERATIVE REPORT   PREOPERATIVE DIAGNOSIS:  Severe left internal carotid stenosis.   POSTOPERATIVE DIAGNOSIS:  Severe left internal carotid stenosis.   OPERATION:  Left carotid endarterectomy.   SURGEON:  Zigmund Daniel, M.D.   ASSISTANT:  Rose Phi. Maple Hudson, M.D.   ANESTHESIA:  General.   PROCEDURE:  After the patient was monitored and anesthetized and had radial  artery catheter for blood pressure monitoring, routine preparation and  draping of the left side of the neck, I made a 6 cm incision in the mid part  of the neck paralleling the anterior border of the sternocleidomastoid  and  angling slightly back as I approached the ear.  I deepened the dissection  through the platysma using cautery.  I then dissected down along the  anterior border of the sternocleidomastoid.  I encountered the internal  jugular vein and saw the common facial vein entering it.  I tied the common  facial vein in continuity with 2-0 silk and divided it.  I then dissected  directly to the carotid pulse and dissected out and controlled the common  carotid, internal carotid, external carotid, and superior thyroid arteries.  During this dissection, I took care to avoid injury to the modular  mandibular branch of the facial nerve, the hypoglossal nerve, the vagus  nerve, and the ansa hypoglossi.  After the patient was heparinized, I  clamped the common carotid and tightened controls on the other vessels.  I  then made a longitudinal arteriotomy about 4 cm in length beginning on the  common carotid and going up the anterior surface of the  internal carotid  past the calcified stenotic plaque.  There was good back bleeding from the  internal carotid.  I put in a 10 [place] straight shunt between the common  carotid and internal carotid flushing the internal carotid back first and  then restoring flow.  I then worked at leisure to do the endarterectomy.  I  created a plane of dissection between the plaque and the healthy portion of  the vessel wall beginning in the common carotid and working upward past the  external carotid.  I then used scissors to divide the plaque as it started  to narrow down into the internal carotid.  I intussuscepted the plaque out  of the external carotid, removed it as high as possible, and then allowed  back bleeding from the external carotid and filled it with heparinized  saline, and reapplied the control.  I then very carefully endarterectomized  the internal carotid artery up to the point where the plaque thinned into  more normal appearing intima.  I cut it transversely and felt that I had  done a safe endarterectomy.  To be certain there was no dissection, I used  three sutures through and through the plaque and vessel wall tied on the  outside to hold the posterior aspect down.  As I irrigated vigorously, there  seemed to be no tendency for dissection.  I then smoothed the  endarterectomized portion of the vessel removing small amounts of smooth  muscle and loose plaque.  I irrigated thoroughly and felt the endarterectomy  was satisfactory.  I then used a Hemashield Finesse patch to do the patch  angioplasty of the arteriotomy beginning in the internal carotid and sewing  down each side.  When a few sutures remained to be placed, I removed the  shunt, back flushed each vessel, and then irrigated out the vessel with  heparinized saline solution.  After completing the patch, I restored flow,  first into the external carotid and then into the internal carotid.  About  our flutes needed repair and  then hemostasis was good.  There was an  excellent pulse in the external and internal carotid vessels.  I then placed  a  piece of Surgicel over the patch and completed the operation.  I closed the  deep tissue and platysma with separate running layers of 3-0 Vicryl and  closed the skin with running intracuticular 4-0 Vicryl and Steri-Strips.  The sponge, needle, and instrument counts were correct.  The patient was  stable through the operation.                                               Lorre Munroe., M.D.    WB/MEDQ  D:  06/23/2004  T:  06/23/2004  Job:  [id number]   cc:   Nanetta Batty, M.D.  Fax: [phone]   [facility]  [address]  [place]  [place] [address]  Fax: [phone]

## 2011-05-08 ENCOUNTER — Other Ambulatory Visit: Payer: Self-pay | Admitting: *Deleted

## 2011-05-08 MED ORDER — ALPRAZOLAM 0.5 MG PO TABS: 0.5000 mg | ORAL_TABLET | Freq: Two times a day (BID) | ORAL | Status: AC | PRN

## 2011-05-08 NOTE — Telephone Encounter (Signed)
rx called into pharmacy

## 2011-05-08 NOTE — Telephone Encounter (Signed)
Okay to change to #60 x 0 Change the instructions to reflect how she is using -- bid prn???

## 2011-05-08 NOTE — Telephone Encounter (Signed)
Pt is asking for #60, states last time she only got 30, and that didn't last a month.

## 2011-06-15 ENCOUNTER — Other Ambulatory Visit: Payer: Self-pay | Admitting: *Deleted

## 2011-06-15 MED ORDER — ALPRAZOLAM 0.5 MG PO TABS: 0.5000 mg | ORAL_TABLET | Freq: Two times a day (BID) | ORAL | Status: DC | PRN

## 2011-06-15 NOTE — Telephone Encounter (Signed)
Okay #60 x 0 

## 2011-06-15 NOTE — Telephone Encounter (Signed)
rx faxed to pharmacy manually  

## 2011-06-15 NOTE — Telephone Encounter (Signed)
Form on your desk  

## 2011-07-13 ENCOUNTER — Other Ambulatory Visit: Payer: Self-pay | Admitting: *Deleted

## 2011-07-13 MED ORDER — ALPRAZOLAM 0.5 MG PO TABS: 0.5000 mg | ORAL_TABLET | Freq: Two times a day (BID) | ORAL | Status: DC | PRN

## 2011-07-13 NOTE — Telephone Encounter (Signed)
Please call in

## 2011-07-16 NOTE — Telephone Encounter (Signed)
Rx called to pharmacy

## 2011-08-24 ENCOUNTER — Encounter: Payer: Self-pay | Admitting: Internal Medicine

## 2011-08-27 ENCOUNTER — Encounter: Payer: Self-pay | Admitting: Internal Medicine

## 2011-08-27 ENCOUNTER — Ambulatory Visit (INDEPENDENT_AMBULATORY_CARE_PROVIDER_SITE_OTHER): Payer: Medicare Other | Admitting: Internal Medicine

## 2011-08-27 VITALS — BP 144/76 | HR 88 | Temp 97.9°F | Ht 65.0 in | Wt 118.0 lb

## 2011-08-27 DIAGNOSIS — E785 Hyperlipidemia, unspecified: Secondary | ICD-10-CM

## 2011-08-27 DIAGNOSIS — I2510 Atherosclerotic heart disease of native coronary artery without angina pectoris: Secondary | ICD-10-CM

## 2011-08-27 DIAGNOSIS — F411 Generalized anxiety disorder: Secondary | ICD-10-CM

## 2011-08-27 DIAGNOSIS — I10 Essential (primary) hypertension: Secondary | ICD-10-CM

## 2011-08-27 LAB — LIPID PANEL
Cholesterol: 140 mg/dL (ref 0–200)
HDL: 47.9 mg/dL (ref >39.00)
Total CHOL/HDL Ratio: 3
Triglycerides: 107 mg/dL (ref 0.0–149.0)

## 2011-08-27 LAB — CBC WITH DIFFERENTIAL/PLATELET
Basophils Relative: 0.7 % (ref 0.0–3.0)
Eosinophils Relative: 1.5 % (ref 0.0–5.0)
HCT: 40.9 % (ref 36.0–46.0)
Hemoglobin: 13.8 g/dL (ref 12.0–15.0)
MCHC: 33.7 g/dL (ref 30.0–36.0)
MCV: 90.8 fl (ref 78.0–100.0)
Monocytes Relative: 6.4 % (ref 3.0–12.0)
Neutro Abs: 5.3 10*3/uL (ref 1.4–7.7)
Neutrophils Relative %: 56.6 % (ref 43.0–77.0)
Platelets: 250 10*3/uL (ref 150.0–400.0)
RBC: 4.51 Mil/uL (ref 3.87–5.11)
WBC: 9.3 10*3/uL (ref 4.5–10.5)

## 2011-08-27 LAB — BASIC METABOLIC PANEL
Calcium: 9.9 mg/dL (ref 8.4–10.5)
Chloride: 101 mEq/L (ref 96–112)
Sodium: 138 mEq/L (ref 135–145)

## 2011-08-27 LAB — HEPATIC FUNCTION PANEL
AST: 20 U/L (ref 0–37)
Albumin: 4.3 g/dL (ref 3.5–5.2)
Total Protein: 7.3 g/dL (ref 6.0–8.3)

## 2011-08-27 MED ORDER — CITALOPRAM HYDROBROMIDE 10 MG PO TABS: 10.0000 mg | ORAL_TABLET | Freq: Every day | ORAL | Status: DC

## 2011-08-27 NOTE — Assessment & Plan Note (Signed)
Ongoing stress This goes back most of her life Some depression but seems secondary to the anxiety Just uses the xanax at bedtime Discussed---will try citalopram

## 2011-08-27 NOTE — Patient Instructions (Signed)
Please start the citalopram for your nerves Call if you have any problems with it

## 2011-08-27 NOTE — Assessment & Plan Note (Signed)
Dr Allyson Sabal follows No acute issues

## 2011-08-27 NOTE — Assessment & Plan Note (Addendum)
BP Readings from Last 3 Encounters:  08/27/11 144/76  02/21/11 140/68  10/30/10 160/90   Has had fair control No changes  Due for labs
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
## 2011-08-27 NOTE — Assessment & Plan Note (Signed)
Lab Results  Component Value Date   LDLCALC 105* 11/14/2009   Will recheck labs

## 2011-09-07 ENCOUNTER — Telehealth: Payer: Self-pay | Admitting: *Deleted

## 2011-09-07 NOTE — Telephone Encounter (Signed)
Please have her try the citalopram daily again--but take at bedtime If it still makes her sleepy, can try every other day This should not be taken just occasionally---it doesn't really work that way

## 2011-09-07 NOTE — Telephone Encounter (Signed)
Pt was started on citalopram but stopped it on Tuesday because it made her so sleepy.  She asks if she can take this once in a while to help relax her, since it does make her so sleepy.  Also, does she need to keep her follow up appt on 10/9, since she isnt taking the medicine?

## 2011-09-07 NOTE — Telephone Encounter (Signed)
Spoke with patient and advised results   

## 2011-09-14 ENCOUNTER — Other Ambulatory Visit: Payer: Self-pay | Admitting: *Deleted

## 2011-09-14 MED ORDER — AMLODIPINE BESYLATE 5 MG PO TABS: 5.0000 mg | ORAL_TABLET | Freq: Every day | ORAL | Status: DC

## 2011-09-18 ENCOUNTER — Other Ambulatory Visit: Payer: Self-pay | Admitting: *Deleted

## 2011-09-18 ENCOUNTER — Ambulatory Visit (INDEPENDENT_AMBULATORY_CARE_PROVIDER_SITE_OTHER): Payer: Medicare Other | Admitting: Internal Medicine

## 2011-09-18 ENCOUNTER — Encounter: Payer: Self-pay | Admitting: Internal Medicine

## 2011-09-18 VITALS — BP 157/69 | HR 80 | Temp 97.8°F | Ht 65.0 in | Wt 118.0 lb

## 2011-09-18 DIAGNOSIS — F411 Generalized anxiety disorder: Secondary | ICD-10-CM

## 2011-09-18 DIAGNOSIS — Z23 Encounter for immunization: Secondary | ICD-10-CM

## 2011-09-18 NOTE — Progress Notes (Signed)
  Subjective:    Patient ID: Paige Ortiz, female    DOB: 01-10-1933, 77 y.o.   MRN: [id number]  HPI Daughter is somewhat better from the Stevens-Johnson syndrome Now has underactive thyroid---needs therapy for this Ongoing stress---support for niece with recent lung cancer diagnosis  Tried the citalopram in AM for 3 days Then switched to night Found it really did slow her down and cause sleepiness Tried every other day but still knocked her out  Current Outpatient Prescriptions on File Prior to Visit  Medication Sig Dispense Refill  . ALPRAZolam (XANAX) 0.5 MG tablet Take 1 tablet (0.5 mg total) by mouth 2 (two) times daily as needed.  60 tablet  0  . amLODipine (NORVASC) 5 MG tablet Take 1 tablet (5 mg total) by mouth daily.  30 tablet  11  . aspirin 81 MG tablet Take 81 mg by mouth daily.        . Calcium Carbonate-Vitamin D (CALCIUM 500 + D) 500-125 MG-UNIT TABS Take by mouth daily.        . celecoxib (CELEBREX) 200 MG capsule Take 200 mg by mouth daily.        . citalopram (CELEXA) 10 MG tablet Take 1 tablet (10 mg total) by mouth daily.  30 tablet  11  . clopidogrel (PLAVIX) 75 MG tablet Take 75 mg by mouth daily.        . simvastatin (ZOCOR) 40 MG tablet Take 40 mg by mouth at bedtime.        . valsartan-hydrochlorothiazide (DIOVAN-HCT) 80-12.5 MG per tablet Take 0.5 tablets by mouth daily.          Allergies  Allergen Reactions  . Bupropion Hcl     REACTION: Suicidal thoughts  . Losartan Potassium-Hctz     REACTION: swelling in feet and legs  . Sertraline Hcl     REACTION: Tired/ sleepy  . Sulfonamide Derivatives   . Tussionex Pennkinetic Er     REACTION: Can't wake up    Past Medical History  Diagnosis Date  . Anxiety   . Hyperlipidemia   . Hypertension   . Disorder of bone and cartilage, unspecified   . Allergy   . GERD (gastroesophageal reflux disease)   . ASCVD (arteriosclerotic cardiovascular disease)   . Breast tumor     left, benign  . Cataract      No past surgical history on file.  Family History  Problem Relation Age of Onset  . Hypertension Mother   . Cancer Sister     lung cancer  . Diabetes Brother     History   Social History  . Marital Status: Married    Spouse Name: N/A    Number of Children: 4  . Years of Education: N/A   Occupational History  . 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 Smoking status: Current Everyday Smoker  . Smokeless tobacco: Never Used  . Alcohol Use: No  . Drug Use: No  . Sexually Active: Not on file   Other Topics Concern  . Not on file   Social History Narrative  . No narrative on file   Review of Systems Able to sleep Appetite is some better Still with tense back---better after massage therapy     Objective:   Physical Exam        Assessment & Plan:

## 2011-09-18 NOTE — Telephone Encounter (Signed)
Form on your desk  

## 2011-09-18 NOTE — Assessment & Plan Note (Signed)
Is better Seems that stress with daughter had really affected her Sedated with citalopram---will stop  Massage therapy helps Dealing with new stress with niece's cancer

## 2011-09-19 MED ORDER — ALPRAZOLAM 0.5 MG PO TABS: 0.5000 mg | ORAL_TABLET | Freq: Two times a day (BID) | ORAL | Status: DC | PRN

## 2011-09-19 NOTE — Telephone Encounter (Signed)
rx faxed to pharmacy manually  

## 2011-09-19 NOTE — Telephone Encounter (Signed)
Okay #60 x 0 

## 2011-10-10 ENCOUNTER — Other Ambulatory Visit: Payer: Self-pay | Admitting: *Deleted

## 2011-10-10 MED ORDER — VALSARTAN-HYDROCHLOROTHIAZIDE 80-12.5 MG PO TABS: 0.5000 | ORAL_TABLET | Freq: Every day | ORAL | Status: DC

## 2011-10-16 ENCOUNTER — Other Ambulatory Visit: Payer: Self-pay | Admitting: *Deleted

## 2011-10-16 MED ORDER — SIMVASTATIN 40 MG PO TABS: 40.0000 mg | ORAL_TABLET | Freq: Every day | ORAL | Status: DC

## 2011-10-16 NOTE — Telephone Encounter (Signed)
Received faxed refill request from pharmacy Refill sent electronically to pharmacy. 

## 2011-11-12 ENCOUNTER — Other Ambulatory Visit: Payer: Self-pay | Admitting: *Deleted

## 2011-11-12 MED ORDER — ALPRAZOLAM 0.5 MG PO TABS: 0.5000 mg | ORAL_TABLET | Freq: Two times a day (BID) | ORAL | Status: DC | PRN

## 2011-11-12 NOTE — Telephone Encounter (Signed)
Okay #60 x 0 

## 2011-11-12 NOTE — Telephone Encounter (Signed)
rx called into pharmacy

## 2011-12-19 ENCOUNTER — Encounter: Payer: Self-pay | Admitting: Internal Medicine

## 2011-12-19 ENCOUNTER — Ambulatory Visit (INDEPENDENT_AMBULATORY_CARE_PROVIDER_SITE_OTHER): Payer: Medicare Other | Admitting: Internal Medicine

## 2011-12-19 DIAGNOSIS — I2510 Atherosclerotic heart disease of native coronary artery without angina pectoris: Secondary | ICD-10-CM

## 2011-12-19 DIAGNOSIS — I10 Essential (primary) hypertension: Secondary | ICD-10-CM

## 2011-12-19 DIAGNOSIS — F411 Generalized anxiety disorder: Secondary | ICD-10-CM

## 2011-12-19 DIAGNOSIS — E785 Hyperlipidemia, unspecified: Secondary | ICD-10-CM

## 2011-12-19 NOTE — Progress Notes (Signed)
Subjective:    Patient ID: Paige Ortiz, female    DOB: April 09, 1933, 79 y.o.   MRN: [id number]  HPI Doing okay Ongoing family health issues--brother, daughter, niece Brother does have dementia but medication has helped a lot Niece seems to have responded to initial chemo Daughter with recurrences of Stevens-Johnson syndrome Still uses the alprazolam at night---discussed occ use in day if stressful  No chest pain No SOB No change in exercise tolerance No sig headaches  occ back pain---feels masseuse would help but too much money No general myalgias  Current Outpatient Prescriptions on File Prior to Visit  Medication Sig Dispense Refill  . ALPRAZolam (XANAX) 0.5 MG tablet Take 1 tablet (0.5 mg total) by mouth 2 (two) times daily as needed.  60 tablet  0  . amLODipine (NORVASC) 5 MG tablet Take 1 tablet (5 mg total) by mouth daily.  30 tablet  11  . aspirin 81 MG tablet Take 81 mg by mouth daily.        . Calcium Carbonate-Vitamin D (CALCIUM 500 + D) 500-125 MG-UNIT TABS Take by mouth daily.        . celecoxib (CELEBREX) 200 MG capsule Take 200 mg by mouth daily.        . clopidogrel (PLAVIX) 75 MG tablet Take 75 mg by mouth daily.        . simvastatin (ZOCOR) 40 MG tablet Take 1 tablet (40 mg total) by mouth at bedtime.  30 tablet  10    Allergies  Allergen Reactions  . Bupropion Hcl     REACTION: Suicidal thoughts  . Losartan Potassium-Hctz     REACTION: swelling in feet and legs  . Sertraline Hcl     REACTION: Tired/ sleepy  . Sulfonamide Derivatives   . Tussionex Pennkinetic Er     REACTION: Can't wake up  . Citalopram     Made her sleepy    Past Medical History  Diagnosis Date  . Anxiety   . Hyperlipidemia   . Hypertension   . Disorder of bone and cartilage, unspecified   . Allergy   . GERD (gastroesophageal reflux disease)   . ASCVD (arteriosclerotic cardiovascular disease)   . Breast tumor     left, benign  . Cataract     No past surgical history on  file.  Family History  Problem Relation Age of Onset  . Hypertension Mother   . Cancer Sister     lung cancer  . Diabetes Brother     History   Social History  . Marital Status: Married    Spouse Name: N/A    Number of Children: 4  . Years of Education: N/A   Occupational History  . 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 Smoking status: Current Everyday Smoker  . Smokeless tobacco: Never Used  . Alcohol Use: No  . Drug Use: No  . Sexually Active: Not on file   Other Topics Concern  . Not on file   Social History Narrative  . No narrative on file   Review of Systems Has been "battling sleep"----full xanax helps initiate but then awakens. Feels this is related to lack of exercise Just restarted walking on treadmill Appetite is variable Weight stable Was due to have eyelid surgery--canceled with daughter's cancer. Will be rescheduling    Objective:   Physical Exam  Constitutional: She appears well-developed and well-nourished. No distress.  Neck: Normal range of motion. Neck supple.  No thyromegaly present.  Cardiovascular: Normal rate, regular rhythm, normal heart sounds and intact distal pulses.  Exam reveals no gallop.   No murmur heard. Pulmonary/Chest: Effort normal and breath sounds normal. No respiratory distress. She has no wheezes. She has no rales.  Abdominal: Soft. There is no tenderness.  Musculoskeletal: She exhibits no edema and no tenderness.  Lymphadenopathy:    She has no cervical adenopathy.  Psychiatric: She has a normal mood and affect. Her behavior is normal. Judgment and thought content normal.       Typical preoccupation with family issues but not overly anxious          Assessment & Plan:

## 2011-12-19 NOTE — Patient Instructions (Signed)
Please increase the diovan/HCT to a full tab daily

## 2011-12-19 NOTE — Assessment & Plan Note (Signed)
Ongoing issues Didn't tolerate various meds Will continue just prn xanax

## 2011-12-19 NOTE — Assessment & Plan Note (Signed)
Lab Results  Component Value Date   LDLCALC 71 08/27/2011   At goal No changes

## 2011-12-19 NOTE — Assessment & Plan Note (Signed)
No new symptoms  On statin, ASA, plavix

## 2011-12-19 NOTE — Assessment & Plan Note (Signed)
BP Readings from Last 3 Encounters:  12/19/11 158/80  09/18/11 157/69  08/27/11 144/76   suboptimal Rx given her history Will increase the diovan to a full pill daily

## 2011-12-25 ENCOUNTER — Encounter (INDEPENDENT_AMBULATORY_CARE_PROVIDER_SITE_OTHER): Payer: Medicare Other | Admitting: Ophthalmology

## 2011-12-25 DIAGNOSIS — H35369 Drusen (degenerative) of macula, unspecified eye: Secondary | ICD-10-CM

## 2011-12-25 DIAGNOSIS — H35039 Hypertensive retinopathy, unspecified eye: Secondary | ICD-10-CM

## 2011-12-25 DIAGNOSIS — H43819 Vitreous degeneration, unspecified eye: Secondary | ICD-10-CM

## 2011-12-25 DIAGNOSIS — I10 Essential (primary) hypertension: Secondary | ICD-10-CM

## 2011-12-25 DIAGNOSIS — H3560 Retinal hemorrhage, unspecified eye: Secondary | ICD-10-CM

## 2012-01-14 ENCOUNTER — Other Ambulatory Visit: Payer: Self-pay | Admitting: *Deleted

## 2012-01-14 MED ORDER — VALSARTAN-HYDROCHLOROTHIAZIDE 80-12.5 MG PO TABS: 1.0000 | ORAL_TABLET | Freq: Every day | ORAL | Status: DC

## 2012-02-06 ENCOUNTER — Encounter (INDEPENDENT_AMBULATORY_CARE_PROVIDER_SITE_OTHER): Payer: Medicare Other | Admitting: Ophthalmology

## 2012-02-06 DIAGNOSIS — I10 Essential (primary) hypertension: Secondary | ICD-10-CM

## 2012-02-06 DIAGNOSIS — H35039 Hypertensive retinopathy, unspecified eye: Secondary | ICD-10-CM

## 2012-02-06 DIAGNOSIS — H43819 Vitreous degeneration, unspecified eye: Secondary | ICD-10-CM

## 2012-02-25 ENCOUNTER — Encounter: Payer: Self-pay | Admitting: Internal Medicine

## 2012-02-25 ENCOUNTER — Ambulatory Visit (INDEPENDENT_AMBULATORY_CARE_PROVIDER_SITE_OTHER): Payer: Medicare Other | Admitting: Internal Medicine

## 2012-02-25 DIAGNOSIS — R5383 Other fatigue: Secondary | ICD-10-CM | POA: Insufficient documentation

## 2012-02-25 DIAGNOSIS — R5381 Other malaise: Secondary | ICD-10-CM

## 2012-02-25 NOTE — Assessment & Plan Note (Signed)
Multiple vague symptoms Did have some sinus symptoms that are better now ??reaction to cortisone shot or tramadol Ongoing stress with family health issues Appears okay now Will check labs No further testing

## 2012-02-25 NOTE — Progress Notes (Signed)
Subjective:    Patient ID: Paige Ortiz, female    DOB: Feb 11, 1933, 78 y.o.   MRN: [id number]  HPI Having a lot of things going on Had been worse last week--now seems she may be somewhat better Going to doctor's visits with a bunch of people--has been stressful  Got cortisone injection in right shoulder by Dr Hyacinth Meeker about 10 days ago Seemed to help at first but then, the next day, "I could not function" Felt listless Got some tramadol --though not using regularly (last one 4 days ago)  Wonders about sinus infection Shaky and "head not right" last week BP was elevated---with her checking No fever evident and no chills (but stays cold)  No sig cough Some nasal/head congestion---using Neti pot and flonase No SOB in past week  Current Outpatient Prescriptions on File Prior to Visit  Medication Sig Dispense Refill  . ALPRAZolam (XANAX) 0.5 MG tablet Take 1 tablet (0.5 mg total) by mouth 2 (two) times daily as needed.  60 tablet  0  . amLODipine (NORVASC) 5 MG tablet Take 1 tablet (5 mg total) by mouth daily.  30 tablet  11  . aspirin 81 MG tablet Take 81 mg by mouth daily.        . Calcium Carbonate-Vitamin D (CALCIUM 500 + D) 500-125 MG-UNIT TABS Take by mouth daily.        . celecoxib (CELEBREX) 200 MG capsule Take 200 mg by mouth daily.        . clopidogrel (PLAVIX) 75 MG tablet Take 75 mg by mouth daily.        . simvastatin (ZOCOR) 40 MG tablet Take 1 tablet (40 mg total) by mouth at bedtime.  30 tablet  10  . valsartan-hydrochlorothiazide (DIOVAN-HCT) 80-12.5 MG per tablet Take 1 tablet by mouth daily.  90 tablet  3    Allergies  Allergen Reactions  . Bupropion Hcl     REACTION: Suicidal thoughts  . Losartan Potassium-Hctz     REACTION: swelling in feet and legs  . Sertraline Hcl     REACTION: Tired/ sleepy  . Sulfonamide Derivatives   . Tussionex Pennkinetic Er     REACTION: Can't wake up  . Citalopram     Made her sleepy    Past Medical History  Diagnosis Date   . Anxiety   . Hyperlipidemia   . Hypertension   . Disorder of bone and cartilage, unspecified   . Allergy   . GERD (gastroesophageal reflux disease)   . ASCVD (arteriosclerotic cardiovascular disease)   . Breast tumor     left, benign  . Cataract     No past surgical history on file.  Family History  Problem Relation Age of Onset  . Hypertension Mother   . Cancer Sister     lung cancer  . Diabetes Brother     History   Social History  . Marital Status: Married    Spouse Name: N/A    Number of Children: 4  . Years of Education: N/A   Occupational History  . 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 Smoking status: Current Everyday Smoker  . Smokeless tobacco: Never Used  . Alcohol Use: No  . Drug Use: No  . Sexually Active: Not on file   Other Topics Concern  . Not on file   Social History Narrative  . No narrative on file   Review of Systems Bruises easy---concerned about the plavix  Has eye procedure (lid lift) next month--wonders if she is well enough for this Appetite is off---food tasted bitter. This has improved now Weight stable    Objective:   Physical Exam  Constitutional: She appears well-developed and well-nourished. No distress.  HENT:  Mouth/Throat: Oropharynx is clear and moist. No oropharyngeal exudate.       Moderate pale nasal congestion  Neck: Normal range of motion. No thyromegaly present.  Cardiovascular: Normal rate, regular rhythm and normal heart sounds.  Exam reveals no gallop.   No murmur heard. Pulmonary/Chest: Effort normal and breath sounds normal. No respiratory distress. She has no wheezes. She has no rales.  Abdominal: Soft. There is no tenderness.  Musculoskeletal: She exhibits no edema and no tenderness.  Lymphadenopathy:    She has no cervical adenopathy.  Psychiatric:       Anxious as usual Discussed family health problems          Assessment & Plan:

## 2012-02-26 LAB — CBC WITH DIFFERENTIAL/PLATELET
Basophils Relative: 0.3 % (ref 0.0–3.0)
Eosinophils Absolute: 0.1 10*3/uL (ref 0.0–0.7)
HCT: 36.7 % (ref 36.0–46.0)
Hemoglobin: 12.4 g/dL (ref 12.0–15.0)
Lymphocytes Relative: 16.2 % (ref 12.0–46.0)
Lymphs Abs: 2.4 10*3/uL (ref 0.7–4.0)
MCV: 91.3 fl (ref 78.0–100.0)
Monocytes Absolute: 0.4 10*3/uL (ref 0.1–1.0)
Monocytes Relative: 2.7 % — ABNORMAL LOW (ref 3.0–12.0)
Platelets: 251 10*3/uL (ref 150.0–400.0)
RBC: 4.02 Mil/uL (ref 3.87–5.11)
RDW: 13.8 % (ref 11.5–14.6)

## 2012-02-26 LAB — HEPATIC FUNCTION PANEL
ALT: 15 U/L (ref 0–35)
AST: 18 U/L (ref 0–37)
Albumin: 3.8 g/dL (ref 3.5–5.2)
Alkaline Phosphatase: 55 U/L (ref 39–117)
Total Protein: 7 g/dL (ref 6.0–8.3)

## 2012-02-26 LAB — BASIC METABOLIC PANEL
BUN: 18 mg/dL (ref 6–23)
CO2: 29 mEq/L (ref 19–32)
Glucose, Bld: 84 mg/dL (ref 70–99)
Sodium: 136 mEq/L (ref 135–145)

## 2012-02-27 ENCOUNTER — Encounter: Payer: Self-pay | Admitting: *Deleted

## 2012-03-05 ENCOUNTER — Telehealth: Payer: Self-pay | Admitting: Internal Medicine

## 2012-03-05 MED ORDER — ALPRAZOLAM 0.5 MG PO TABS: 0.5000 mg | ORAL_TABLET | Freq: Two times a day (BID) | ORAL | Status: DC | PRN

## 2012-03-05 MED ORDER — AMOXICILLIN 500 MG PO CAPS: 1000.0000 mg | ORAL_CAPSULE | Freq: Two times a day (BID) | ORAL | Status: DC

## 2012-03-05 NOTE — Telephone Encounter (Signed)
Triage Record Num: [id number] Operator: Durward Mallard DiMatteis Patient Name: Paige Ortiz Call Date & Time: 03/05/2012 12:53:03PM Patient Phone: [phone] PCP: Tillman Abide Patient Gender: Female PCP Fax : [phone] Patient DOB: 10-19-1933 Practice Name: [facility] Reason for Call: Caller: [name]/Patient; PCP: Tillman Abide I.; CB#: [phone]; Call regarding cold sx; sx started 03/04/12; sx include sneezing and head congestion; feels like congestion is in chest; was seen in the office last week and MD didn't feel like she had a sinus infection; no fever; coughing up yellow sputum; Triaged per URI Guideline; See in 24 hr d/t productive cough with colored sputum; appt made for 3:45pm tomorrow, 03/06/12,with Dr Alphonsus Sias Protocol(s) Used: Upper Respiratory Infection (URI) Recommended Outcome per Protocol: See Provider within 24 hours Reason for Outcome: Productive cough with colored sputum (other than clear or white sputum) Care Advice: ~ Use a cool mist humidifier to moisten air. Be sure to clean according to manufacturer's instructions. Increase fluids to 8-12 eight oz (1.6 to 2.4 liters) glasses per day, half of them to be water. Soups, popsicles, fruit juices, non-caffeinated sodas (unless restricting sodium intake), jello, broths, decaf teas, etc. are all okay. Warm fluids can be soothing. ~ ~ Warm fluids may help, or try a mixture of honey and lemon juice in warm tea. Analgesic/Antipyretic Advice - Acetaminophen: Consider acetaminophen as directed on label or by pharmacist/provider for pain or fever PRECAUTIONS: - Use if there is no history of liver disease, alcoholism, or intake of three or more alcohol drinks per day - Only if approved by provider during pregnancy or when breastfeeding - During pregnancy, acetaminophen should not be taken more than 3 consecutive days without telling provider - Do not exceed recommended dose or frequency ~ Call provider if has a  fever over 101.5 F (38.6 C) that has not responded to home care measures, having shaking chills or any fever in someone immunocompromised/frail elderly. ~ 03/05/2012 1:12:30PM Page 1 of 1 [id number]

## 2012-03-05 NOTE — Telephone Encounter (Signed)
Okay #60 x 0 for alprazolam  avelox is overkill for her infection----mostly for severe infections like pneumonia She should call next week if not better---we can try something stronger

## 2012-03-05 NOTE — Telephone Encounter (Signed)
Please call Since I just saw her and she had sinus symptoms, I think it is better to try an antibiotic and then I can see her next week if she still isn't better Cancel tomorrow's appt  Send rx for amoxil 500mg  #40 x 0 2 bid x 10 days

## 2012-03-05 NOTE — Telephone Encounter (Signed)
rx called into pharmacy Left detailed message on home VM, advised pt to call if any problems

## 2012-03-05 NOTE — Telephone Encounter (Signed)
Patient wanted to ask if Avelox would work better? She's heard from her friends that it really knocks out infections? Per pt request I send in amoxicillin because she needed to start something tonight, Also pt wanted a refill of xanax.

## 2012-03-06 ENCOUNTER — Ambulatory Visit: Payer: Medicare Other | Admitting: Internal Medicine

## 2012-03-17 ENCOUNTER — Telehealth: Payer: Self-pay | Admitting: Internal Medicine

## 2012-03-17 NOTE — Telephone Encounter (Signed)
Caller: Paige Ortiz/Patient; PCP: Tillman Abide I.; CB#: [phone];  Call regarding Questions About Medications and Blood Pressure; BP was 154/79 this AM. She is concerned as she is having an upper and lower blepharoplasy for ptosis on 03/26/12. She has also called her surgeon regarding her concerns. Is waiting on a call back. Pt's BP was 170/80 at her last OV on 02/25/12. Denies chest pain or breathing difficulties. Homecare advised per HTN Protocol. Call back parameters reviewed.

## 2012-03-17 NOTE — Telephone Encounter (Signed)
I am not concerned about that level of blood pressure and I doubt the surgeon wouldn't operate due to it I would not change meds unless it is over 160 regularly

## 2012-03-17 NOTE — Telephone Encounter (Signed)
early in the process of dehydration. So does a dark yellow, concentrated yellow. If the urine is light straw colored, your child is not dehydrated. - Dry tongue and inside of the mouth. Dry lips are not helpful. - Decreased or absent tears - In infants, a depressed or sunken soft spot - Irritable, tired out or acting ill. If your child is alert, happy and playful, he or she is not dehydrated. ~ 04/

## 2012-03-17 NOTE — Telephone Encounter (Signed)
Spoke with patient and advised results She said thank you for answering her questions

## 2012-04-09 ENCOUNTER — Other Ambulatory Visit: Payer: Self-pay | Admitting: *Deleted

## 2012-04-09 MED ORDER — ALPRAZOLAM 0.5 MG PO TABS: 0.5000 mg | ORAL_TABLET | Freq: Two times a day (BID) | ORAL | Status: DC | PRN

## 2012-04-09 NOTE — Telephone Encounter (Signed)
rx called into pharmacy

## 2012-04-09 NOTE — Telephone Encounter (Signed)
Okay #60 x 0 

## 2012-05-30 ENCOUNTER — Other Ambulatory Visit: Payer: Self-pay | Admitting: *Deleted

## 2012-05-30 MED ORDER — CELECOXIB 200 MG PO CAPS: 200.0000 mg | ORAL_CAPSULE | Freq: Every day | ORAL | Status: DC

## 2012-05-30 NOTE — Telephone Encounter (Signed)
rx sent to pharmacy by e-script  

## 2012-05-30 NOTE — Telephone Encounter (Signed)
Okay #30 x 5 

## 2012-05-30 NOTE — Telephone Encounter (Signed)
Ok to fill 

## 2012-06-10 ENCOUNTER — Other Ambulatory Visit: Payer: Self-pay | Admitting: *Deleted

## 2012-06-10 NOTE — Telephone Encounter (Signed)
Faxed refill request from [facility], last filled 04/09/12.

## 2012-06-11 MED ORDER — ALPRAZOLAM 0.5 MG PO TABS: 0.5000 mg | ORAL_TABLET | Freq: Two times a day (BID) | ORAL | Status: DC | PRN

## 2012-06-11 NOTE — Telephone Encounter (Signed)
rx called into pharmacy

## 2012-06-11 NOTE — Telephone Encounter (Signed)
Okay #60 x 0 

## 2012-06-17 ENCOUNTER — Encounter: Payer: Self-pay | Admitting: Internal Medicine

## 2012-06-17 ENCOUNTER — Ambulatory Visit (INDEPENDENT_AMBULATORY_CARE_PROVIDER_SITE_OTHER): Payer: Medicare Other | Admitting: Internal Medicine

## 2012-06-17 VITALS — BP 148/70 | HR 68 | Temp 98.3°F | Ht 61.0 in | Wt 105.0 lb

## 2012-06-17 DIAGNOSIS — F411 Generalized anxiety disorder: Secondary | ICD-10-CM

## 2012-06-17 DIAGNOSIS — I10 Essential (primary) hypertension: Secondary | ICD-10-CM

## 2012-06-17 DIAGNOSIS — R634 Abnormal weight loss: Secondary | ICD-10-CM | POA: Insufficient documentation

## 2012-06-17 NOTE — Assessment & Plan Note (Signed)
ongong Let her vent as usual Discussed need to take care of herself

## 2012-06-17 NOTE — Progress Notes (Signed)
Subjective:    Patient ID: Paige Ortiz, female    DOB: 1933/03/27, 78 y.o.   MRN: [id number]  HPI "I am coping" Ongoing multiple personal and social issues Having sleep problems---xanax no help. Tried tramadol from Dr Hyacinth Meeker and the xanax and she is able to sleep Stays very tense Nerves are still bad Husband has been acting up again some--has had to leave house at times to get away Doesn't feel threatened though Keeps all this angst inside  Still gets upset stomach No nausea or vomiting Stable bowel symptoms---variable like IBS. No blood  No chest pain No SOB  Doesn't have some back pain Feels into sternal area Mostly notes some heartburn later in afternoon  Current Outpatient Prescriptions on File Prior to Visit  Medication Sig Dispense Refill  . ALPRAZolam (XANAX) 0.5 MG tablet Take 1 tablet (0.5 mg total) by mouth 2 (two) times daily as needed.  60 tablet  0  . amLODipine (NORVASC) 5 MG tablet Take 1 tablet (5 mg total) by mouth daily.  30 tablet  11  . aspirin 81 MG tablet Take 81 mg by mouth daily.        . Calcium Carbonate-Vitamin D (CALCIUM 500 + D) 500-125 MG-UNIT TABS Take by mouth daily.        . celecoxib (CELEBREX) 200 MG capsule Take 1 capsule (200 mg total) by mouth daily.  30 capsule  5  . clopidogrel (PLAVIX) 75 MG tablet Take 75 mg by mouth daily.        . simvastatin (ZOCOR) 40 MG tablet Take 1 tablet (40 mg total) by mouth at bedtime.  30 tablet  10  . valsartan-hydrochlorothiazide (DIOVAN-HCT) 80-12.5 MG per tablet Take 1 tablet by mouth daily.  90 tablet  3    Allergies  Allergen Reactions  . Bupropion Hcl     REACTION: Suicidal thoughts  . Hydrocod Polst-Cpm Polst Er     REACTION: Can't wake up  . Losartan Potassium-Hctz     REACTION: swelling in feet and legs  . Sertraline Hcl     REACTION: Tired/ sleepy  . Sulfonamide Derivatives   . Citalopram     Made her sleepy    Past Medical History  Diagnosis Date  . Anxiety   . Hyperlipidemia     . Hypertension   . Disorder of bone and cartilage, unspecified   . Allergy   . GERD (gastroesophageal reflux disease)   . ASCVD (arteriosclerotic cardiovascular disease)   . Breast tumor     left, benign  . Cataract     No past surgical history on file.  Family History  Problem Relation Age of Onset  . Hypertension Mother   . Cancer Sister     lung cancer  . Diabetes Brother     History   Social History  . Marital Status: Married    Spouse Name: N/A    Number of Children: 4  . Years of Education: N/A   Occupational History  . 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 Smoking status: Current Everyday Smoker  . Smokeless tobacco: Never Used  . Alcohol Use: No  . Drug Use: No  . Sexually Active: Not on file   Other Topics Concern  . Not on file   Social History Narrative  . No narrative on file   Review of Systems Not eating right Has lost 13# since last visit    Objective:  Physical Exam  Constitutional: She appears well-developed and well-nourished. No distress.  Neck: Normal range of motion. Neck supple.  Cardiovascular: Normal rate, regular rhythm and normal heart sounds.  Exam reveals no gallop.   No murmur heard. Pulmonary/Chest: Effort normal and breath sounds normal. No respiratory distress. She has no wheezes. She has no rales.  Abdominal: Soft. Bowel sounds are normal. She exhibits no distension and no mass. There is tenderness. There is no rebound and no guarding.       Mild epigastric tenderness  Musculoskeletal: She exhibits no edema and no tenderness.  Lymphadenopathy:    She has no cervical adenopathy.  Psychiatric: Her behavior is normal.       Anxious as usual          Assessment & Plan:

## 2012-06-17 NOTE — Patient Instructions (Signed)
Please try over the counter prilosec (omeprazole) 20mg  daily ----on an empty stomach. You need to eat 3 regular meals a day. If your appetite is not great---try ensure or boost (rather than just skipping meal)

## 2012-06-17 NOTE — Assessment & Plan Note (Signed)
BP Readings from Last 3 Encounters:  06/17/12 148/70  02/25/12 170/80  12/19/11 158/80   Control is reasonable No changes for now

## 2012-06-17 NOTE — Assessment & Plan Note (Signed)
Seems to be related to her stress Has epigastric tenderness that she thinks goes back for years Discussed trying omeprazole for a while Needs to eat Further work up if not better in a montn

## 2012-07-15 ENCOUNTER — Other Ambulatory Visit: Payer: Self-pay | Admitting: *Deleted

## 2012-07-15 MED ORDER — ALPRAZOLAM 0.5 MG PO TABS: 0.5000 mg | ORAL_TABLET | Freq: Two times a day (BID) | ORAL | Status: DC | PRN

## 2012-07-15 NOTE — Telephone Encounter (Signed)
plz phone in. 

## 2012-07-15 NOTE — Telephone Encounter (Signed)
LETVAK PATIENT, ok to fill? 

## 2012-07-16 NOTE — Telephone Encounter (Signed)
rx called into pharmacy

## 2012-07-22 ENCOUNTER — Ambulatory Visit (INDEPENDENT_AMBULATORY_CARE_PROVIDER_SITE_OTHER): Payer: Medicare Other | Admitting: Internal Medicine

## 2012-07-22 ENCOUNTER — Encounter: Payer: Self-pay | Admitting: Internal Medicine

## 2012-07-22 VITALS — BP 152/62 | HR 84 | Temp 97.6°F | Ht 61.0 in | Wt 114.0 lb

## 2012-07-22 DIAGNOSIS — F411 Generalized anxiety disorder: Secondary | ICD-10-CM

## 2012-07-22 DIAGNOSIS — R634 Abnormal weight loss: Secondary | ICD-10-CM

## 2012-07-22 NOTE — Assessment & Plan Note (Signed)
Ongoing but does seem better this time Trying to limit alprazolam to avoid dependence

## 2012-07-22 NOTE — Progress Notes (Signed)
Subjective:    Patient ID: Paige Ortiz, female    DOB: 01/05/1933, 78 y.o.   MRN: [id number]  HPI Feels "I am too caring and too loving" ?feels she puts herself out "by my nature" Admits that she was "angry with life" and this took away her appetite  Niece finished her brain RT for cancer She has been very involved in this emotionally  Ongoing stress but seems to have settled down some Husband still aggravates her but has been better lately  Eating better--back to eating breakfast Has limited her coffee---feels it may have caused some of her nausea Weight is back up 9#  Current Outpatient Prescriptions on File Prior to Visit  Medication Sig Dispense Refill  . ALPRAZolam (XANAX) 0.5 MG tablet Take 1 tablet (0.5 mg total) by mouth 2 (two) times daily as needed.  60 tablet  0  . amLODipine (NORVASC) 5 MG tablet Take 1 tablet (5 mg total) by mouth daily.  30 tablet  11  . aspirin 81 MG tablet Take 81 mg by mouth daily.        . Calcium Carbonate-Vitamin D (CALCIUM 500 + D) 500-125 MG-UNIT TABS Take by mouth daily.        . celecoxib (CELEBREX) 200 MG capsule Take 1 capsule (200 mg total) by mouth daily.  30 capsule  5  . clopidogrel (PLAVIX) 75 MG tablet Take 75 mg by mouth daily.        . simvastatin (ZOCOR) 40 MG tablet Take 1 tablet (40 mg total) by mouth at bedtime.  30 tablet  10  . traMADol (ULTRAM) 50 MG tablet Take 50 mg by mouth as needed.       . valsartan-hydrochlorothiazide (DIOVAN-HCT) 80-12.5 MG per tablet Take 1 tablet by mouth daily.  90 tablet  3    Allergies  Allergen Reactions  . Bupropion Hcl     REACTION: Suicidal thoughts  . Hydrocod Polst-Cpm Polst Er     REACTION: Can't wake up  . Losartan Potassium-Hctz     REACTION: swelling in feet and legs  . Sertraline Hcl     REACTION: Tired/ sleepy  . Sulfonamide Derivatives   . Citalopram     Made her sleepy    Past Medical History  Diagnosis Date  . Anxiety   . Hyperlipidemia   . Hypertension   .  Disorder of bone and cartilage, unspecified   . Allergy   . GERD (gastroesophageal reflux disease)   . ASCVD (arteriosclerotic cardiovascular disease)   . Breast tumor     left, benign  . Cataract     No past surgical history on file.  Family History  Problem Relation Age of Onset  . Hypertension Mother   . Cancer Sister     lung cancer  . Diabetes Brother     History   Social History  . Marital Status: Married    Spouse Name: N/A    Number of Children: 4  . Years of Education: N/A   Occupational History  . 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 Smoking status: Current Everyday Smoker  . Smokeless tobacco: Never Used  . Alcohol Use: No  . Drug Use: No  . Sexually Active: Not on file   Other Topics Concern  . Not on file   Social History Narrative  . No narrative on file   Review of Systems Sleeping is still not very good Hiatal hernia acts  up at times---especially if she isn't careful with her eating Gets tired ache in low back. Trying honey daily to see if that helps    Objective:   Physical Exam  Psychiatric:       Still slightly pressured speech Tears slightly talking about growing up with brother More relaxed than last visit though          Assessment & Plan:

## 2012-07-22 NOTE — Assessment & Plan Note (Signed)
Weight back up again Admits that her stomach reacts with her stress

## 2012-09-09 ENCOUNTER — Ambulatory Visit (INDEPENDENT_AMBULATORY_CARE_PROVIDER_SITE_OTHER): Payer: Medicare Other

## 2012-09-09 DIAGNOSIS — Z23 Encounter for immunization: Secondary | ICD-10-CM

## 2012-09-17 ENCOUNTER — Other Ambulatory Visit: Payer: Self-pay | Admitting: *Deleted

## 2012-09-17 MED ORDER — ALPRAZOLAM 0.5 MG PO TABS: 0.5000 mg | ORAL_TABLET | Freq: Two times a day (BID) | ORAL | Status: DC | PRN

## 2012-09-17 NOTE — Telephone Encounter (Signed)
Last filled 07/16/12

## 2012-09-17 NOTE — Telephone Encounter (Signed)
Okay #60 x 0 

## 2012-09-17 NOTE — Telephone Encounter (Signed)
rx called into pharmacy

## 2012-09-25 ENCOUNTER — Telehealth: Payer: Self-pay | Admitting: Internal Medicine

## 2012-09-25 NOTE — Telephone Encounter (Signed)
.  left message to have patient return my call.  

## 2012-09-25 NOTE — Telephone Encounter (Signed)
Please [facility] caught a potential interaction with her amlodipine and simvastatin Please have her decrease the simvastatin to 1/2 tab (20mg ) instead of 40mg  This is the highest simvastatin dose recommended when amlodipine is also taken

## 2012-09-26 NOTE — Telephone Encounter (Signed)
Spoke with patient and advised results, med list adjusted.

## 2012-09-30 ENCOUNTER — Other Ambulatory Visit: Payer: Self-pay | Admitting: *Deleted

## 2012-09-30 MED ORDER — SIMVASTATIN 20 MG PO TABS: 20.0000 mg | ORAL_TABLET | Freq: Every evening | ORAL | Status: DC

## 2012-10-16 ENCOUNTER — Telehealth: Payer: Self-pay

## 2012-10-16 NOTE — Telephone Encounter (Signed)
Pt request 02/26/12 lab results faxed to Dr Jeri Cos Cardiologist [phone]. Notified pt done.

## 2012-10-30 ENCOUNTER — Other Ambulatory Visit: Payer: Self-pay | Admitting: *Deleted

## 2012-10-30 MED ORDER — AMLODIPINE BESYLATE 5 MG PO TABS: 5.0000 mg | ORAL_TABLET | Freq: Every day | ORAL | Status: DC

## 2012-11-10 ENCOUNTER — Encounter: Payer: Self-pay | Admitting: Internal Medicine

## 2012-11-10 ENCOUNTER — Ambulatory Visit (INDEPENDENT_AMBULATORY_CARE_PROVIDER_SITE_OTHER): Payer: Medicare Other | Admitting: Internal Medicine

## 2012-11-10 VITALS — BP 160/80 | HR 78 | Temp 97.8°F | Wt 117.0 lb

## 2012-11-10 DIAGNOSIS — L03114 Cellulitis of left upper limb: Secondary | ICD-10-CM | POA: Insufficient documentation

## 2012-11-10 DIAGNOSIS — IMO0002 Reserved for concepts with insufficient information to code with codable children: Secondary | ICD-10-CM

## 2012-11-10 DIAGNOSIS — J069 Acute upper respiratory infection, unspecified: Secondary | ICD-10-CM

## 2012-11-10 DIAGNOSIS — F411 Generalized anxiety disorder: Secondary | ICD-10-CM

## 2012-11-10 MED ORDER — CEPHALEXIN 500 MG PO TABS: 500.0000 mg | ORAL_TABLET | Freq: Three times a day (TID) | ORAL | Status: DC

## 2012-11-10 NOTE — Progress Notes (Signed)
Subjective:    Patient ID: Paige Ortiz, female    DOB: Apr 02, 1933, 78 y.o.   MRN: [id number]  HPI 1 week ago She saw something on her elbow while getting dressed Thought it was a thread---so she rolled it off (looked like a string) Then skin was pink like "new skin" Tried some dermatology cream (?steroid), then neosporin  4 days ago, she thought it looked infected No fever Did have some discharge from the wound Then tried some betadine after that  Weight is up a couple pounds  "nervous wreck" about the elbow---but anxiety is generally controlled  Current Outpatient Prescriptions on File Prior to Visit  Medication Sig Dispense Refill  . ALPRAZolam (XANAX) 0.5 MG tablet Take 1 tablet (0.5 mg total) by mouth 2 (two) times daily as needed.  60 tablet  0  . amLODipine (NORVASC) 5 MG tablet Take 1 tablet (5 mg total) by mouth daily.  30 tablet  6  . aspirin 81 MG tablet Take 81 mg by mouth daily.        . Calcium Carbonate-Vitamin D (CALCIUM 500 + D) 500-125 MG-UNIT TABS Take by mouth daily.        . celecoxib (CELEBREX) 200 MG capsule Take 1 capsule (200 mg total) by mouth daily.  30 capsule  5  . clopidogrel (PLAVIX) 75 MG tablet Take 75 mg by mouth daily.        . simvastatin (ZOCOR) 20 MG tablet Take 1 tablet (20 mg total) by mouth every evening.  30 tablet  6  . traMADol (ULTRAM) 50 MG tablet Take 50 mg by mouth as needed.       . valsartan-hydrochlorothiazide (DIOVAN-HCT) 80-12.5 MG per tablet Take 1 tablet by mouth daily.  90 tablet  3    Allergies  Allergen Reactions  . Bupropion Hcl     REACTION: Suicidal thoughts  . Hydrocod Polst-Cpm Polst Er     REACTION: Can't wake up  . Losartan Potassium-Hctz     REACTION: swelling in feet and legs  . Sertraline Hcl     REACTION: Tired/ sleepy  . Sulfonamide Derivatives   . Citalopram     Made her sleepy    Past Medical History  Diagnosis Date  . Anxiety   . Hyperlipidemia   . Hypertension   . Disorder of bone and  cartilage, unspecified   . Allergy   . GERD (gastroesophageal reflux disease)   . ASCVD (arteriosclerotic cardiovascular disease)   . Breast tumor     left, benign  . Cataract     No past surgical history on file.  Family History  Problem Relation Age of Onset  . Hypertension Mother   . Cancer Sister     lung cancer  . Diabetes Brother     History   Social History  . Marital Status: Married    Spouse Name: N/A    Number of Children: 4  . Years of Education: N/A   Occupational History  . 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 Smoking status: Current Every Day Smoker  . Smokeless tobacco: Never Used  . Alcohol Use: No  . Drug Use: No  . Sexually Active: Not on file   Other Topics Concern  . Not on file   Social History Narrative  . No narrative on file   Review of Systems Started with cold symptoms a couple of weeks ago Went down into chest and then  switched from zyrtec D to mucinex at pharmacist's recommendation Tongue is some red--but throat isn't sore    Objective:   Physical Exam  Constitutional: She appears well-developed and well-nourished. No distress.  HENT:       Slight pharyngeal injection without exudates  Neck: Normal range of motion. Neck supple.  Pulmonary/Chest: Effort normal. No respiratory distress. She has no wheezes. She has no rales.       Mild rhonchi but not tight  Musculoskeletal: She exhibits no edema.  Lymphadenopathy:    She has no cervical adenopathy.  Skin:       ~4x3 cm shallow ulcer on left elbow. Crusted eschar along lateral border (circular area) Some redness and swelling over medial part of elbow  Psychiatric: Her behavior is normal.       Usual mild anxiety Appropriate affect          Assessment & Plan:

## 2012-11-10 NOTE — Patient Instructions (Signed)
Please continue to clean your elbow gently with soap and water, pat dry, then cover with bacitracin cream daily. Call if it is not clearing up in 2-3 weeks (stable healing scab)  Please cancel next week's visit

## 2012-11-10 NOTE — Assessment & Plan Note (Signed)
Seems to be viral No Rx needed Still not ready to stop smoking

## 2012-11-10 NOTE — Assessment & Plan Note (Signed)
Ongoing stress  Seems controlled with her meds now

## 2012-11-10 NOTE — Assessment & Plan Note (Signed)
The ulcer looks okay but does have some early changes of cellulitis along the medial border May need debridement if that crusted area doesn't heal

## 2012-11-12 ENCOUNTER — Telehealth: Payer: Self-pay | Admitting: Internal Medicine

## 2012-11-12 NOTE — Telephone Encounter (Signed)
Patient Information:  Caller Name: Labresha  Phone: [phone]  Patient: Paige Ortiz, Paige Ortiz  Gender: Female  DOB: 1932-12-23  Age: 78 Years  PCP: Tillman Abide [facility])   Symptoms  Reason For Call & Symptoms: Patient seen in the office Monday 12/2 for infection on her elbow.  She started the medication given and is asking if it should be worse then it was on Monday?  It looked aweful last night but has improved some today.  Reviewed Health History In EMR: Yes  Reviewed Medications In EMR: Yes  Reviewed Allergies In EMR: Yes  Reviewed Surgeries / Procedures: Yes  Date of Onset of Symptoms: 11/10/2012  Treatments Tried: antibiotics given  Treatments Tried Worked: Yes  Guideline(s) Used:  Wound Infection  Disposition Per Guideline:   Home Care  Reason For Disposition Reached:   Taking antibiotic < 72 hours (3 days) and infected wound doesn't look better  Advice Given:  Expected Course:  Pain and swelling normally peak on day 2. Any redness should go away by day 3 or 4. Complete healing should occur by day 10.  Call Back If:   Wound becomes more tender  Redness starts to spread  You become worse  Office Follow Up:  Does the office need to follow up with this patient?: No  Instructions For The Office: N/A  RN Note:  Encouraged to continue with her medications and wound tx as MD instructed her on.  There was increased drainage yesterday and the area increased in size 1 1/2 times.  Today the drainage has decreased and she is seeing some improvement.

## 2012-11-12 NOTE — Telephone Encounter (Signed)
Please check with her tomorrow to be sure it is still improving

## 2012-11-13 NOTE — Telephone Encounter (Signed)
.  left message to have patient return my call. Just calling to see how she was doing.

## 2012-11-14 ENCOUNTER — Ambulatory Visit (INDEPENDENT_AMBULATORY_CARE_PROVIDER_SITE_OTHER): Payer: Medicare Other | Admitting: Internal Medicine

## 2012-11-14 ENCOUNTER — Encounter: Payer: Self-pay | Admitting: Internal Medicine

## 2012-11-14 VITALS — BP 160/70 | HR 88 | Temp 97.2°F | Ht 61.0 in | Wt 115.2 lb

## 2012-11-14 DIAGNOSIS — L03114 Cellulitis of left upper limb: Secondary | ICD-10-CM

## 2012-11-14 DIAGNOSIS — IMO0002 Reserved for concepts with insufficient information to code with codable children: Secondary | ICD-10-CM

## 2012-11-14 MED ORDER — CLINDAMYCIN HCL 300 MG PO CAPS: 300.0000 mg | ORAL_CAPSULE | Freq: Three times a day (TID) | ORAL | Status: DC

## 2012-11-14 NOTE — Progress Notes (Signed)
Subjective:    Patient ID: Paige Ortiz, female    DOB: [birth year], 78 y.o.   MRN: [id number]  HPI Still worried about her left arm "I am a nervous wreck and paranoid"  May be healing but forearm seems swollen and warm No fever Slight discomfort only  Current Outpatient Prescriptions on File Prior to Visit  Medication Sig Dispense Refill  . ALPRAZolam (XANAX) 0.5 MG tablet Take 1 tablet (0.5 mg total) by mouth 2 (two) times daily as needed.  60 tablet  0  . amLODipine (NORVASC) 5 MG tablet Take 1 tablet (5 mg total) by mouth daily.  30 tablet  6  . aspirin 81 MG tablet Take 81 mg by mouth daily.        . Calcium Carbonate-Vitamin D (CALCIUM 500 + D) 500-125 MG-UNIT TABS Take by mouth daily.        . celecoxib (CELEBREX) 200 MG capsule Take 1 capsule (200 mg total) by mouth daily.  30 capsule  5  . Cephalexin 500 MG tablet Take 1 tablet (500 mg total) by mouth 3 (three) times daily.  30 tablet  0  . clopidogrel (PLAVIX) 75 MG tablet Take 75 mg by mouth daily.        . simvastatin (ZOCOR) 20 MG tablet Take 1 tablet (20 mg total) by mouth every evening.  30 tablet  6  . traMADol (ULTRAM) 50 MG tablet Take 50 mg by mouth as needed.       . valsartan-hydrochlorothiazide (DIOVAN-HCT) 80-12.5 MG per tablet Take 1 tablet by mouth daily.  90 tablet  3    Allergies  Allergen Reactions  . Bupropion Hcl     REACTION: Suicidal thoughts  . Hydrocod Polst-Cpm Polst Er     REACTION: Can't wake up  . Losartan Potassium-Hctz     REACTION: swelling in feet and legs  . Sertraline Hcl     REACTION: Tired/ sleepy  . Sulfonamide Derivatives   . Citalopram     Made her sleepy    Past Medical History  Diagnosis Date  . Anxiety   . Hyperlipidemia   . Hypertension   . Disorder of bone and cartilage, unspecified   . Allergy   . GERD (gastroesophageal reflux disease)   . ASCVD (arteriosclerotic cardiovascular disease)   . Breast tumor     left, benign  . Cataract     No past surgical  history on file.  Family History  Problem Relation Age of Onset  . Hypertension Mother   . Cancer Sister     lung cancer  . Diabetes Brother     History   Social History  . Marital Status: Married    Spouse Name: N/A    Number of Children: 4  . Years of Education: N/A   Occupational History  . 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 Smoking status: Current Every Day Smoker  . Smokeless tobacco: Never Used  . Alcohol Use: No  . Drug Use: No  . Sexually Active: Not on file   Other Topics Concern  . Not on file   Social History Narrative  . No narrative on file   Review of Systems Bruises easy---new slight red spot on right arm that then quickly expanded into bigger ecchymotic area    Objective:   Physical Exam  Musculoskeletal:       Proximal swelling of proximal left forearm which is not red, warm or  tender  Skin:       Ulcer over left elbow is more swollen [place] now in wound Redness is more prominent   Psychiatric:       Very anxious          Assessment & Plan:

## 2012-11-14 NOTE — Telephone Encounter (Signed)
Okay Will reevaluate then

## 2012-11-14 NOTE — Patient Instructions (Signed)
Stop the cephalexin Start the clindamycin today----get 2 doses in

## 2012-11-14 NOTE — Telephone Encounter (Signed)
Pt said think may look a little better; sees no red streaks but entire arm is swollen; pt would feel better if Dr Alphonsus Sias rechecked. Pt scheduled appt today at 2:30.

## 2012-11-14 NOTE — Assessment & Plan Note (Signed)
Has worsened Will stop the keflex Change to clindamycin

## 2012-11-18 ENCOUNTER — Telehealth: Payer: Self-pay | Admitting: Internal Medicine

## 2012-11-18 ENCOUNTER — Ambulatory Visit: Payer: Self-pay | Admitting: Family Medicine

## 2012-11-18 ENCOUNTER — Ambulatory Visit (INDEPENDENT_AMBULATORY_CARE_PROVIDER_SITE_OTHER): Payer: Medicare Other | Admitting: Internal Medicine

## 2012-11-18 ENCOUNTER — Encounter: Payer: Self-pay | Admitting: Internal Medicine

## 2012-11-18 VITALS — BP 180/60 | HR 90 | Temp 98.5°F | Wt 114.0 lb

## 2012-11-18 DIAGNOSIS — IMO0002 Reserved for concepts with insufficient information to code with codable children: Secondary | ICD-10-CM

## 2012-11-18 DIAGNOSIS — L03114 Cellulitis of left upper limb: Secondary | ICD-10-CM

## 2012-11-18 DIAGNOSIS — Z888 Allergy status to other drugs, medicaments and biological substances status: Secondary | ICD-10-CM

## 2012-11-18 DIAGNOSIS — T7840XA Allergy, unspecified, initial encounter: Secondary | ICD-10-CM

## 2012-11-18 MED ORDER — DOXYCYCLINE HYCLATE 100 MG PO TABS: 100.0000 mg | ORAL_TABLET | Freq: Two times a day (BID) | ORAL | Status: DC

## 2012-11-18 MED ORDER — ALPRAZOLAM 0.5 MG PO TABS: 0.5000 mg | ORAL_TABLET | Freq: Two times a day (BID) | ORAL | Status: DC | PRN

## 2012-11-18 NOTE — Assessment & Plan Note (Signed)
Will change to doxy It is a little better, forearm swelling is better

## 2012-11-18 NOTE — Patient Instructions (Signed)
Please start the doxycycline. Schedule in for next week If you are not getting better, I will refer you to an infection specialist

## 2012-11-18 NOTE — Telephone Encounter (Signed)
This is the problem with our templates  She should have been able to be scheduled with me at 10:30 open slot and they couldn't  This is very frustrating

## 2012-11-18 NOTE — Assessment & Plan Note (Signed)
Seems to be IgE mediated allergic reaction Off the clinda Will hold off on prednisone

## 2012-11-18 NOTE — Telephone Encounter (Signed)
[facility] Triage Call Report Triage Record Num: [id number] Operator: Tomasita Crumble Patient Name: Paige Ortiz Call Date & Time: 11/17/2012 7:50:50PM Patient Phone: [phone] PCP: Tillman Abide Patient Gender: Female PCP Fax : [phone] Patient DOB: 12-05-1933 Practice Name: [facility] Reason for Call: Caller: [name]; PCP: Tillman Abide (Family Practice); CB#: [phone]; Call regarding Taking Clindamycin 300 mg TID started Friday and now has Hives onset 11/16/12. Eyelids are swollen. Emergent symptoms ruled out. Call provider within 4 hours per Hives protocol due to "New onset of hives after beginning new prescribed, nonprescribed or alternative/complementary medication." She is on medication for "infection in elbow bone". Dr. Artist Pais on call. He was given information, reviewed chart and ordered to stop Clindamycin. Benadryl 25 mg tonight, and follow up with MD on 12/10 for appointment early in the day. If any tongue or lip swelling go to ED for same. Caller was given instructions as ordered. States she will take Hydroxyzine for symptoms as she does not tolerate Benadryl and has none available. Appointment with Dr.Gutierrez at 08:15. (unable to override open appointment for physical with Dr. Alphonsus Sias) Protocol(s) Used: Hives Recommended Outcome per Protocol: Call Provider within 4 Hours Reason for Outcome: New onset of hives after beginning new prescribed, nonprescribed, or alternative/complementary medication Care Advice: Cool/tepid showers or baths may help relieve itching. If cool water alone does not relieve itching, try adding 1/2 to 1 cup baking soda or colloidal oatmeal (Aveeno) to bath water. ~ ~ Speak with provider before next dose of medication is due. ~ Should not be alone for the next 24 hours in case symptoms worsen. ~ IMMEDIATE ACTION ~ CAUTIONS ~ List, or take, all current prescription(s), nonprescription or alternative medication(s) to provider  for evaluation. If an allergy is identified, tell all healthcare providers of your allergy. Even if a first-time reaction caused mild symptoms, a future response to the same allergen may cause more serious symptoms. Wear medical identification to alert others in case of an emergency. ~ Call EMS 911 if develop signs and symptoms of anaphylaxis within minutes to several hours of exposure: severe difficulty breathing; rapid, weak or irregular pulse; pruritus, urticaria, swelling of face, lips, tongue, or throat causing tightness or difficulty swallowing; abdominal cramping, nausea, vomiting or diarrhea. ~ If immediately available, consider taking an appropriate dose of a nonprescription antihistamine (e.g., Benadryl) orally as directed by the label or a pharmacist. These medications may cause drowsiness and should be taken with caution by adults 75 years or older. ~ 11/17/2012 8:24:56PM Page 1 of 1 [id number]

## 2012-11-18 NOTE — Telephone Encounter (Signed)
rx called into pharmacy Patient was seen today

## 2012-11-18 NOTE — Progress Notes (Signed)
Subjective:    Patient ID: Paige Ortiz, female    DOB: January 19, 1933, 78 y.o.   MRN: [id number]  HPI Here with daughter Paige Ortiz clindamycin started 4 days ago Was taking it tid as directed Noticed swelling yesterday. Itching has started a few days ago Last night did start with the hives  Stopped the clindamycin Hives still worse than last night Periorbital edema No mouth swelling or SOB---but mouth has abnormal sensation Some lip puffiness No wheezing--but daughter thinks she may have had some  Arm swelling is better ?wound some better  Current Outpatient Prescriptions on File Prior to Visit  Medication Sig Dispense Refill  . ALPRAZolam (XANAX) 0.5 MG tablet Take 1 tablet (0.5 mg total) by mouth 2 (two) times daily as needed.  60 tablet  0  . amLODipine (NORVASC) 5 MG tablet Take 1 tablet (5 mg total) by mouth daily.  30 tablet  6  . aspirin 81 MG tablet Take 81 mg by mouth daily.        . Calcium Carbonate-Vitamin D (CALCIUM 500 + D) 500-125 MG-UNIT TABS Take by mouth daily.        . celecoxib (CELEBREX) 200 MG capsule Take 1 capsule (200 mg total) by mouth daily.  30 capsule  5  . clopidogrel (PLAVIX) 75 MG tablet Take 75 mg by mouth daily.        . simvastatin (ZOCOR) 20 MG tablet Take 1 tablet (20 mg total) by mouth every evening.  30 tablet  6  . traMADol (ULTRAM) 50 MG tablet Take 50 mg by mouth as needed.       . valsartan-hydrochlorothiazide (DIOVAN-HCT) 80-12.5 MG per tablet Take 1 tablet by mouth daily.  90 tablet  3    Allergies  Allergen Reactions  . Clindamycin/Lincomycin Hives  . Bupropion Hcl     REACTION: Suicidal thoughts  . Hydrocod Polst-Cpm Polst Er     REACTION: Can't wake up  . Losartan Potassium-Hctz     REACTION: swelling in feet and legs  . Sertraline Hcl     REACTION: Tired/ sleepy  . Sulfonamide Derivatives   . Citalopram     Made her sleepy    Past Medical History  Diagnosis Date  . Anxiety   . Hyperlipidemia   . Hypertension    . Disorder of bone and cartilage, unspecified   . Allergy   . GERD (gastroesophageal reflux disease)   . ASCVD (arteriosclerotic cardiovascular disease)   . Breast tumor     left, benign  . Cataract     No past surgical history on file.  Family History  Problem Relation Age of Onset  . Hypertension Mother   . Cancer Sister     lung cancer  . Diabetes Brother     History   Social History  . Marital Status: Married    Spouse Name: N/A    Number of Children: 4  . Years of Education: N/A   Occupational History  . 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 Smoking status: Current Every Day Smoker  . Smokeless tobacco: Never Used  . Alcohol Use: No  . Drug Use: No  . Sexually Active: Not on file   Other Topics Concern  . Not on file   Social History Narrative  . No narrative on file   Review of Systems No fever--but face hot to the touch Did have loose stools since 2 days ago----loose but  not frequent     Objective:   Physical Exam  Constitutional: She appears well-developed and well-nourished. No distress.  HENT:  Mouth/Throat: Oropharynx is clear and moist. No oropharyngeal exudate.       No lip swelling No oral lesions  Eyes:       Edema in upper cheeks  Skin:       Slight redness on neck and face---no clear hives now  Elbow ulcer looks about the same Forearm is no longer swollen          Assessment & Plan:

## 2012-11-21 ENCOUNTER — Ambulatory Visit: Payer: Medicare Other | Admitting: Internal Medicine

## 2012-11-24 ENCOUNTER — Other Ambulatory Visit (HOSPITAL_COMMUNITY): Payer: Self-pay | Admitting: Cardiovascular Disease

## 2012-11-24 DIAGNOSIS — I731 Thromboangiitis obliterans [Buerger's disease]: Secondary | ICD-10-CM

## 2012-11-25 ENCOUNTER — Other Ambulatory Visit (HOSPITAL_COMMUNITY): Payer: Self-pay | Admitting: Cardiovascular Disease

## 2012-11-25 DIAGNOSIS — I739 Peripheral vascular disease, unspecified: Secondary | ICD-10-CM

## 2012-11-26 ENCOUNTER — Encounter: Payer: Self-pay | Admitting: Internal Medicine

## 2012-11-26 ENCOUNTER — Ambulatory Visit (INDEPENDENT_AMBULATORY_CARE_PROVIDER_SITE_OTHER): Payer: Medicare Other | Admitting: Internal Medicine

## 2012-11-26 VITALS — BP 150/60 | HR 78 | Temp 98.4°F | Wt 113.0 lb

## 2012-11-26 DIAGNOSIS — IMO0002 Reserved for concepts with insufficient information to code with codable children: Secondary | ICD-10-CM

## 2012-11-26 DIAGNOSIS — L03114 Cellulitis of left upper limb: Secondary | ICD-10-CM

## 2012-11-26 NOTE — Progress Notes (Signed)
Subjective:    Patient ID: Paige Ortiz, female    DOB: 17-Oct-1933, 78 y.o.   MRN: [id number]  HPI Has generally tolerated the doxycycline Has had loose stools but slightly better in past couple of days Tries to eat yogurt regularly and probiotic before recent infection  No fever Arm seems some better No sig discharge Still no pain  Current Outpatient Prescriptions on File Prior to Visit  Medication Sig Dispense Refill  . ALPRAZolam (XANAX) 0.5 MG tablet Take 1 tablet (0.5 mg total) by mouth 2 (two) times daily as needed.  60 tablet  0  . amLODipine (NORVASC) 5 MG tablet Take 1 tablet (5 mg total) by mouth daily.  30 tablet  6  . aspirin 81 MG tablet Take 81 mg by mouth daily.        . Calcium Carbonate-Vitamin D (CALCIUM 500 + D) 500-125 MG-UNIT TABS Take by mouth daily.        . celecoxib (CELEBREX) 200 MG capsule Take 1 capsule (200 mg total) by mouth daily.  30 capsule  5  . clopidogrel (PLAVIX) 75 MG tablet Take 75 mg by mouth daily.        Marland Kitchen doxycycline (VIBRA-TABS) 100 MG tablet Take 1 tablet (100 mg total) by mouth 2 (two) times daily.  20 tablet  1  . simvastatin (ZOCOR) 20 MG tablet Take 1 tablet (20 mg total) by mouth every evening.  30 tablet  6  . traMADol (ULTRAM) 50 MG tablet Take 50 mg by mouth as needed.       . valsartan-hydrochlorothiazide (DIOVAN-HCT) 80-12.5 MG per tablet Take 1 tablet by mouth daily.  90 tablet  3    Allergies  Allergen Reactions  . Clindamycin/Lincomycin Hives  . Bupropion Hcl     REACTION: Suicidal thoughts  . Hydrocod Polst-Cpm Polst Er     REACTION: Can't wake up  . Losartan Potassium-Hctz     REACTION: swelling in feet and legs  . Sertraline Hcl     REACTION: Tired/ sleepy  . Sulfonamide Derivatives   . Citalopram     Made her sleepy    Past Medical History  Diagnosis Date  . Anxiety   . Hyperlipidemia   . Hypertension   . Disorder of bone and cartilage, unspecified   . Allergy   . GERD (gastroesophageal reflux disease)    . ASCVD (arteriosclerotic cardiovascular disease)   . Breast tumor     left, benign  . Cataract     No past surgical history on file.  Family History  Problem Relation Age of Onset  . Hypertension Mother   . Cancer Sister     lung cancer  . Diabetes Brother     History   Social History  . Marital Status: Married    Spouse Name: N/A    Number of Children: 4  . Years of Education: N/A   Occupational History  . 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 Smoking status: Current Every Day Smoker  . Smokeless tobacco: Never Used  . Alcohol Use: No  . Drug Use: No  . Sexually Active: Not on file   Other Topics Concern  . Not on file   Social History Narrative  . No narrative on file   Review of Systems Has always had a "nervous stomach" Appetite is down--not hungry but will still eat Weight down 1#    Objective:   Physical Exam  Constitutional:  She appears well-developed and well-nourished. No distress.  Musculoskeletal:       Ulcer is over modest swelling of left olecranon bursa  Skin:       Ulcer is smaller--down to ~38mm with some yellow slough Surrounding redness without warmth or tenderness          Assessment & Plan:

## 2012-11-26 NOTE — Assessment & Plan Note (Signed)
Now clearly seems to be overlying inflamed olecranon bursa Ulcer is now ~50% closed Some slough but will have her continue to clean with soap and water and cover with neosporin If it worsens, we will try another course of the doxy

## 2012-11-26 NOTE — Patient Instructions (Signed)
Please continue to clean the wound with soap and water daily to clean the discharge Continue to cover daily with neosporin Ice or cool compresses may help with the mild swelling there (the bursa) If the redness worsens again, or the wound is draining more, we should restart the doxycycline antibiotic

## 2012-12-29 ENCOUNTER — Other Ambulatory Visit: Payer: Self-pay | Admitting: *Deleted

## 2012-12-29 NOTE — Telephone Encounter (Signed)
Okay alprazolam #60 x 0 celebrex okay for a year

## 2012-12-30 ENCOUNTER — Ambulatory Visit (INDEPENDENT_AMBULATORY_CARE_PROVIDER_SITE_OTHER): Payer: Medicare Other | Admitting: Internal Medicine

## 2012-12-30 ENCOUNTER — Encounter: Payer: Self-pay | Admitting: Internal Medicine

## 2012-12-30 VITALS — BP 140/60 | HR 72 | Temp 98.4°F | Wt 111.0 lb

## 2012-12-30 DIAGNOSIS — IMO0002 Reserved for concepts with insufficient information to code with codable children: Secondary | ICD-10-CM

## 2012-12-30 DIAGNOSIS — L03114 Cellulitis of left upper limb: Secondary | ICD-10-CM

## 2012-12-30 DIAGNOSIS — F411 Generalized anxiety disorder: Secondary | ICD-10-CM

## 2012-12-30 MED ORDER — ALPRAZOLAM 0.5 MG PO TABS: 0.5000 mg | ORAL_TABLET | Freq: Two times a day (BID) | ORAL | Status: DC | PRN

## 2012-12-30 MED ORDER — CELECOXIB 200 MG PO CAPS: 200.0000 mg | ORAL_CAPSULE | Freq: Every day | ORAL | Status: DC

## 2012-12-30 NOTE — Assessment & Plan Note (Signed)
Resolved No bursa swelling Still slight residual redness and eschar Reassured her this looks okay now

## 2012-12-30 NOTE — Assessment & Plan Note (Addendum)
Ongoing stress Biggest factor is dear niece struggling with terminal cancer. Has been referred to hospice now Counseling for 20 of 25 minute visit She asks for meds I think the alprazolam is her best option (has failed a couple of meds-- SSRI/SNRI)  Will consider mirtazapine if she loses more weight at follow up

## 2012-12-30 NOTE — Patient Instructions (Signed)
Please try a fiber supplement to help with your bowels

## 2012-12-30 NOTE — Telephone Encounter (Signed)
rx sent to pharmacy by e-script rx called into pharmacy  

## 2012-12-30 NOTE — Progress Notes (Signed)
Subjective:    Patient ID: Paige Ortiz, female    DOB: 1933/04/23, 79 y.o.   MRN: [id number]  HPI Finished out the antibiotic Made her feel sick and had diarrhea  Left arm is a lot better Not back to normal though Has been wearing a patch over it till a few days ago No drainage  Current Outpatient Prescriptions on File Prior to Visit  Medication Sig Dispense Refill  . ALPRAZolam (XANAX) 0.5 MG tablet Take 1 tablet (0.5 mg total) by mouth 2 (two) times daily as needed.  60 tablet  0  . amLODipine (NORVASC) 5 MG tablet Take 1 tablet (5 mg total) by mouth daily.  30 tablet  6  . aspirin 81 MG tablet Take 81 mg by mouth daily.        . celecoxib (CELEBREX) 200 MG capsule Take 1 capsule (200 mg total) by mouth daily.  30 capsule  11  . cholecalciferol (VITAMIN D) 1000 UNITS tablet Take 1,000 Units by mouth daily.      . clopidogrel (PLAVIX) 75 MG tablet Take 75 mg by mouth daily.        . simvastatin (ZOCOR) 20 MG tablet Take 1 tablet (20 mg total) by mouth every evening.  30 tablet  6  . traMADol (ULTRAM) 50 MG tablet Take 50 mg by mouth as needed.       . valsartan-hydrochlorothiazide (DIOVAN-HCT) 80-12.5 MG per tablet Take 1 tablet by mouth daily.  90 tablet  3    Allergies  Allergen Reactions  . Clindamycin/Lincomycin Hives  . Bupropion Hcl     REACTION: Suicidal thoughts  . Hydrocod Polst-Cpm Polst Er     REACTION: Can't wake up  . Losartan Potassium-Hctz     REACTION: swelling in feet and legs  . Sertraline Hcl     REACTION: Tired/ sleepy  . Sulfonamide Derivatives   . Citalopram     Made her sleepy    Past Medical History  Diagnosis Date  . Anxiety   . Hyperlipidemia   . Hypertension   . Disorder of bone and cartilage, unspecified   . Allergy   . GERD (gastroesophageal reflux disease)   . ASCVD (arteriosclerotic cardiovascular disease)   . Breast tumor     left, benign  . Cataract     No past surgical history on file.  Family History  Problem Relation  Age of Onset  . Hypertension Mother   . Cancer Sister     lung cancer  . Diabetes Brother     History   Social History  . Marital Status: Married    Spouse Name: N/A    Number of Children: 4  . Years of Education: N/A   Occupational History  . 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 Smoking status: Current Every Day Smoker  . Smokeless tobacco: Never Used  . Alcohol Use: No  . Drug Use: No  . Sexually Active: Not on file   Other Topics Concern  . Not on file   Social History Narrative  . No narrative on file   Review of Systems No fever Eating is still not great Ongoing stress as usual for her Weight is down 2# Chronic constipation    Objective:   Physical Exam  Constitutional: She appears well-developed. No distress.  Musculoskeletal:       Slight redness over ulnar prominence but no clear bursa swelling.  Small stable eschar over middle  No warmth or tenderness          Assessment & Plan:

## 2013-01-02 ENCOUNTER — Encounter (HOSPITAL_COMMUNITY): Payer: Medicare Other

## 2013-01-06 ENCOUNTER — Encounter (HOSPITAL_COMMUNITY): Payer: Medicare Other

## 2013-01-13 ENCOUNTER — Other Ambulatory Visit: Payer: Self-pay | Admitting: *Deleted

## 2013-01-13 MED ORDER — VALSARTAN-HYDROCHLOROTHIAZIDE 80-12.5 MG PO TABS: 1.0000 | ORAL_TABLET | Freq: Every day | ORAL | Status: DC

## 2013-02-16 ENCOUNTER — Other Ambulatory Visit: Payer: Self-pay | Admitting: *Deleted

## 2013-02-16 MED ORDER — ALPRAZOLAM 0.5 MG PO TABS: 0.5000 mg | ORAL_TABLET | Freq: Two times a day (BID) | ORAL | Status: DC | PRN

## 2013-02-16 NOTE — Telephone Encounter (Signed)
Ok to refill in Dr. Letvak's absence? 

## 2013-02-16 NOTE — Telephone Encounter (Signed)
plz phone in. 

## 2013-02-17 NOTE — Telephone Encounter (Signed)
Rx called in as directed.   

## 2013-03-20 ENCOUNTER — Other Ambulatory Visit: Payer: Self-pay | Admitting: *Deleted

## 2013-03-20 MED ORDER — ALPRAZOLAM 0.5 MG PO TABS: 0.5000 mg | ORAL_TABLET | Freq: Two times a day (BID) | ORAL | Status: DC | PRN

## 2013-03-20 NOTE — Telephone Encounter (Signed)
Okay #60 x 0 

## 2013-03-20 NOTE — Telephone Encounter (Signed)
rx called into pharmacy

## 2013-03-20 NOTE — Telephone Encounter (Signed)
Last filled: 02/17/13.

## 2013-03-30 ENCOUNTER — Ambulatory Visit: Payer: Medicare Other | Admitting: Internal Medicine

## 2013-03-31 ENCOUNTER — Ambulatory Visit: Payer: Medicare Other | Admitting: Internal Medicine

## 2013-04-02 ENCOUNTER — Ambulatory Visit (INDEPENDENT_AMBULATORY_CARE_PROVIDER_SITE_OTHER): Payer: Medicare Other | Admitting: Internal Medicine

## 2013-04-02 ENCOUNTER — Encounter: Payer: Self-pay | Admitting: Internal Medicine

## 2013-04-02 VITALS — BP 150/60 | HR 74 | Temp 98.3°F | Wt 105.0 lb

## 2013-04-02 DIAGNOSIS — E785 Hyperlipidemia, unspecified: Secondary | ICD-10-CM

## 2013-04-02 DIAGNOSIS — F411 Generalized anxiety disorder: Secondary | ICD-10-CM

## 2013-04-02 DIAGNOSIS — I10 Essential (primary) hypertension: Secondary | ICD-10-CM

## 2013-04-02 DIAGNOSIS — K219 Gastro-esophageal reflux disease without esophagitis: Secondary | ICD-10-CM

## 2013-04-02 NOTE — Assessment & Plan Note (Signed)
No dysphagia or active symptoms

## 2013-04-02 NOTE — Progress Notes (Signed)
Subjective:    Patient ID: Paige Ortiz, female    DOB: July 15, 1933, 79 y.o.   MRN: [id number]  HPI Arm is all better  Niece with lung cancer died in 2023-01-14 Never accepted that she was dying Her mother is in [facility] and now the father has frontal lobe dementia and requires around the clock care and Cailan needs to manage all this Doing finances, etc etc. Their money is gone  No chest pain No SOB Some dizziness but no syncope  Some aching Relates to stress,etc No obvious problems with the statin  Current Outpatient Prescriptions on File Prior to Visit  Medication Sig Dispense Refill  . ALPRAZolam (XANAX) 0.5 MG tablet Take 1 tablet (0.5 mg total) by mouth 2 (two) times daily as needed.  60 tablet  0  . amLODipine (NORVASC) 5 MG tablet Take 1 tablet (5 mg total) by mouth daily.  30 tablet  6  . aspirin 81 MG tablet Take 81 mg by mouth daily.        . celecoxib (CELEBREX) 200 MG capsule Take 1 capsule (200 mg total) by mouth daily.  30 capsule  11  . cholecalciferol (VITAMIN D) 1000 UNITS tablet Take 1,000 Units by mouth daily.      . clopidogrel (PLAVIX) 75 MG tablet Take 75 mg by mouth daily.        . simvastatin (ZOCOR) 20 MG tablet Take 1 tablet (20 mg total) by mouth every evening.  30 tablet  6  . traMADol (ULTRAM) 50 MG tablet Take 50 mg by mouth as needed.       . valsartan-hydrochlorothiazide (DIOVAN-HCT) 80-12.5 MG per tablet Take 1 tablet by mouth daily.  90 tablet  1   No current facility-administered medications on file prior to visit.    Allergies  Allergen Reactions  . Clindamycin/Lincomycin Hives  . Bupropion Hcl     REACTION: Suicidal thoughts  . Hydrocod Polst-Cpm Polst Er     REACTION: Can't wake up  . Losartan Potassium-Hctz     REACTION: swelling in feet and legs  . Sertraline Hcl     REACTION: Tired/ sleepy  . Sulfonamide Derivatives   . Citalopram     Made her sleepy    Past Medical History  Diagnosis Date  . Anxiety   . Hyperlipidemia   .  Hypertension   . Disorder of bone and cartilage, unspecified   . Allergy   . GERD (gastroesophageal reflux disease)   . ASCVD (arteriosclerotic cardiovascular disease)   . Breast tumor     left, benign  . Cataract     No past surgical history on file.  Family History  Problem Relation Age of Onset  . Hypertension Mother   . Cancer Sister     lung cancer  . Diabetes Brother     History   Social History  . Marital Status: Married    Spouse Name: N/A    Number of Children: 4  . Years of Education: N/A   Occupational History  . 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 Smoking status: Current Every Day Smoker  . Smokeless tobacco: Never Used  . Alcohol Use: No  . Drug Use: No  . Sexually Active: Not on file   Other Topics Concern  . Not on file   Social History Narrative  . No narrative on file   Review of Systems Not eating well --- has lost some weight Has  the "fidgets"---wonders about restless legs (happens intermittently) Now will feel like she has to move around (all parts of her body) Pica for crushed ice now Colonoscopy long ago    Objective:   Physical Exam  Constitutional: She appears well-developed. No distress.  Neck: Normal range of motion. Neck supple. No thyromegaly present.  Cardiovascular: Normal rate, regular rhythm and normal heart sounds.  Exam reveals no gallop.   No murmur heard. Pulmonary/Chest: Effort normal and breath sounds normal. No respiratory distress. She has no wheezes. She has no rales.  Abdominal: Soft. There is no tenderness.  Musculoskeletal: She exhibits no edema and no tenderness.  Lymphadenopathy:    She has no cervical adenopathy.  Psychiatric: Her behavior is normal.  Anxious---pressured with her descriptions of family stress          Assessment & Plan:

## 2013-04-02 NOTE — Assessment & Plan Note (Signed)
Ongoing severe family stress The work falls on her and she takes it hard Continue the xanax

## 2013-04-02 NOTE — Assessment & Plan Note (Signed)
BP Readings from Last 3 Encounters:  04/02/13 150/60  12/30/12 140/60  11/26/12 150/60   This is okay for her age No changes Check labs---concern for anemia (may need GI consult-- requests Dr Mechele Collin)

## 2013-04-02 NOTE — Assessment & Plan Note (Signed)
No apparent problems with the med Due for labs

## 2013-04-03 ENCOUNTER — Other Ambulatory Visit: Payer: Self-pay | Admitting: Internal Medicine

## 2013-04-03 DIAGNOSIS — D509 Iron deficiency anemia, unspecified: Secondary | ICD-10-CM

## 2013-04-03 LAB — BASIC METABOLIC PANEL
Chloride: 93 mEq/L — ABNORMAL LOW (ref 96–112)
GFR: 46.22 mL/min — ABNORMAL LOW (ref >60.00)
Potassium: 3.6 mEq/L (ref 3.5–5.1)
Sodium: 128 mEq/L — ABNORMAL LOW (ref 135–145)

## 2013-04-03 LAB — CBC WITH DIFFERENTIAL/PLATELET
Basophils Relative: 2.5 % (ref 0.0–3.0)
Eosinophils Relative: 1.6 % (ref 0.0–5.0)
HCT: 27.6 % — ABNORMAL LOW (ref 36.0–46.0)
Hemoglobin: 8.8 g/dL — ABNORMAL LOW (ref 12.0–15.0)
Lymphs Abs: 3.5 10*3/uL (ref 0.7–4.0)
MCV: 67.7 fl — ABNORMAL LOW (ref 78.0–100.0)
Monocytes Absolute: 0.5 10*3/uL (ref 0.1–1.0)
Monocytes Relative: 5.1 % (ref 3.0–12.0)
RBC: 4.08 Mil/uL (ref 3.87–5.11)
WBC: 10.6 10*3/uL — ABNORMAL HIGH (ref 4.5–10.5)

## 2013-04-03 LAB — HEPATIC FUNCTION PANEL
ALT: 13 U/L (ref 0–35)
AST: 20 U/L (ref 0–37)
Albumin: 4.1 g/dL (ref 3.5–5.2)
Alkaline Phosphatase: 70 U/L (ref 39–117)

## 2013-04-03 LAB — LIPID PANEL
Cholesterol: 118 mg/dL (ref 0–200)
LDL Cholesterol: 58 mg/dL (ref 0–99)
Total CHOL/HDL Ratio: 3
VLDL: 14.8 mg/dL (ref 0.0–40.0)

## 2013-04-03 LAB — TSH: TSH: 0.61 u[IU]/mL (ref 0.35–5.50)

## 2013-04-07 ENCOUNTER — Encounter: Payer: Self-pay | Admitting: Internal Medicine

## 2013-04-08 ENCOUNTER — Telehealth: Payer: Self-pay

## 2013-04-08 NOTE — Telephone Encounter (Signed)
Pt left v/m requesting call back from Dr Alphonsus Sias ( and no one else) re; lab results showing significant anemia. Chassie spoke with Bonita Quin about appt to see Dr Mechele Collin and pt said she will not see Dr Mechele Collin until she speaks with Dr Alphonsus Sias.Pt request call back on 04/09/13.

## 2013-04-09 NOTE — Telephone Encounter (Signed)
Dr. Alphonsus Sias I spoke with patient also and she had the understanding that when she saw Dr.Elliott he was doing a colonoscopy at her visit. I advised pt that she was seeing Dr.Elliott because of the anemia, pt states Dr.Letvak told me I was getting a colonoscopy? Pt thought Dr.Letvak made the final decision and could override Dr.Elliott? Pt was very confused she wanted to know when the procedure was?

## 2013-04-09 NOTE — Telephone Encounter (Signed)
Discussed with her She will have evaluation first---then endoscopy She can ask him any questions also She will proceed with the appt

## 2013-04-15 ENCOUNTER — Telehealth: Payer: Self-pay

## 2013-04-15 NOTE — Telephone Encounter (Signed)
Pt left v/m requesting lab results from 03/2013 mailed to pt's home address. Done and pt notified by v/m on home phone mailed.

## 2013-05-10 DIAGNOSIS — D126 Benign neoplasm of colon, unspecified: Secondary | ICD-10-CM

## 2013-05-10 HISTORY — DX: Benign neoplasm of colon, unspecified: D12.6

## 2013-05-12 ENCOUNTER — Ambulatory Visit: Payer: Medicare Other | Admitting: Internal Medicine

## 2013-05-20 ENCOUNTER — Other Ambulatory Visit: Payer: Self-pay | Admitting: *Deleted

## 2013-05-20 MED ORDER — CLOPIDOGREL BISULFATE 75 MG PO TABS: 75.0000 mg | ORAL_TABLET | Freq: Every day | ORAL | Status: DC

## 2013-05-20 MED ORDER — ALPRAZOLAM 0.5 MG PO TABS: 0.5000 mg | ORAL_TABLET | Freq: Two times a day (BID) | ORAL | Status: DC | PRN

## 2013-05-20 MED ORDER — SIMVASTATIN 20 MG PO TABS: 20.0000 mg | ORAL_TABLET | Freq: Every evening | ORAL | Status: DC

## 2013-05-20 NOTE — Telephone Encounter (Signed)
rx called into pharmacy rx sent to pharmacy by e-script  

## 2013-05-20 NOTE — Telephone Encounter (Signed)
Okay #60 x 0 

## 2013-05-20 NOTE — Telephone Encounter (Signed)
Received faxed refill request from pharmacy. Last office visit 04/02/13. Is it okay to refill?

## 2013-05-28 ENCOUNTER — Ambulatory Visit: Payer: Self-pay | Admitting: Unknown Physician Specialty

## 2013-06-03 ENCOUNTER — Encounter: Payer: Self-pay | Admitting: Internal Medicine

## 2013-06-09 ENCOUNTER — Encounter: Payer: Self-pay | Admitting: Internal Medicine

## 2013-06-10 ENCOUNTER — Encounter: Payer: Self-pay | Admitting: Internal Medicine

## 2013-06-18 ENCOUNTER — Encounter: Payer: Self-pay | Admitting: Unknown Physician Specialty

## 2013-06-18 ENCOUNTER — Other Ambulatory Visit: Payer: Self-pay | Admitting: *Deleted

## 2013-06-18 MED ORDER — AMLODIPINE BESYLATE 5 MG PO TABS: 5.0000 mg | ORAL_TABLET | Freq: Every day | ORAL | Status: DC

## 2013-07-07 ENCOUNTER — Other Ambulatory Visit: Payer: Self-pay | Admitting: *Deleted

## 2013-07-07 NOTE — Telephone Encounter (Signed)
Last filled 05/20/13

## 2013-07-08 MED ORDER — ALPRAZOLAM 0.5 MG PO TABS: 0.5000 mg | ORAL_TABLET | Freq: Two times a day (BID) | ORAL | Status: DC | PRN

## 2013-07-08 NOTE — Telephone Encounter (Signed)
rx called into pharmacy

## 2013-07-08 NOTE — Telephone Encounter (Signed)
Okay #60 x 0 

## 2013-07-27 ENCOUNTER — Telehealth (HOSPITAL_COMMUNITY): Payer: Self-pay | Admitting: Cardiovascular Disease

## 2013-08-03 ENCOUNTER — Encounter (HOSPITAL_COMMUNITY): Payer: Medicare Other

## 2013-08-13 ENCOUNTER — Other Ambulatory Visit: Payer: Self-pay | Admitting: *Deleted

## 2013-08-13 MED ORDER — ALPRAZOLAM 0.5 MG PO TABS: 0.5000 mg | ORAL_TABLET | Freq: Two times a day (BID) | ORAL | Status: DC | PRN

## 2013-08-13 NOTE — Telephone Encounter (Signed)
Okay #60 x 0 

## 2013-08-13 NOTE — Telephone Encounter (Signed)
Rx called to pharmacy as instructed. 

## 2013-08-17 ENCOUNTER — Encounter (HOSPITAL_COMMUNITY): Payer: Medicare Other

## 2013-09-01 ENCOUNTER — Other Ambulatory Visit: Payer: Self-pay | Admitting: *Deleted

## 2013-09-01 NOTE — Telephone Encounter (Signed)
disreguard pharmacist already handled this

## 2013-09-16 ENCOUNTER — Encounter: Payer: Self-pay | Admitting: Internal Medicine

## 2013-09-16 ENCOUNTER — Ambulatory Visit (INDEPENDENT_AMBULATORY_CARE_PROVIDER_SITE_OTHER): Payer: Medicare Other | Admitting: Internal Medicine

## 2013-09-16 VITALS — BP 158/88 | HR 76 | Temp 97.1°F | Wt 108.8 lb

## 2013-09-16 DIAGNOSIS — J3489 Other specified disorders of nose and nasal sinuses: Secondary | ICD-10-CM

## 2013-09-16 DIAGNOSIS — R0981 Nasal congestion: Secondary | ICD-10-CM

## 2013-09-16 DIAGNOSIS — Z23 Encounter for immunization: Secondary | ICD-10-CM

## 2013-09-16 NOTE — Progress Notes (Signed)
Subjective:    Patient ID: Paige Ortiz, female    DOB: 1933/05/14, 79 y.o.   MRN: [id number]  HPI Has been sick Doesn't think she has bad fall allergies but has been sneezing for 3 weeks Bad rhinorrhea and maxillary headache  Feels it was affecting her eyes---scratchy and running Saw eye doctor--had blocked tear duct on left but right eye still bothering her Diagnosed with allergies in eye also Given drop samples--has helped some  Head is very clogged still Zyrtec D not really helping Clear but thick nasal discharge Some PND with some cough  Throat feels scratchy No fever Daughter has the same thing  No other meds  Current Outpatient Prescriptions on File Prior to Visit  Medication Sig Dispense Refill  . ALPRAZolam (XANAX) 0.5 MG tablet Take 1 tablet (0.5 mg total) by mouth 2 (two) times daily as needed.  60 tablet  0  . amLODipine (NORVASC) 5 MG tablet Take 1 tablet (5 mg total) by mouth daily.  30 tablet  5  . aspirin 81 MG tablet Take 81 mg by mouth daily.        . celecoxib (CELEBREX) 200 MG capsule Take 1 capsule (200 mg total) by mouth daily.  30 capsule  11  . cholecalciferol (VITAMIN D) 1000 UNITS tablet Take 1,000 Units by mouth daily.      . clopidogrel (PLAVIX) 75 MG tablet Take 1 tablet (75 mg total) by mouth daily.  30 tablet  6  . simvastatin (ZOCOR) 20 MG tablet Take 1 tablet (20 mg total) by mouth every evening.  30 tablet  11  . traMADol (ULTRAM) 50 MG tablet Take 50 mg by mouth as needed.       . valsartan-hydrochlorothiazide (DIOVAN-HCT) 80-12.5 MG per tablet Take 1 tablet by mouth daily.  90 tablet  1   No current facility-administered medications on file prior to visit.    Allergies  Allergen Reactions  . Clindamycin/Lincomycin Hives  . Bupropion Hcl     REACTION: Suicidal thoughts  . Hydrocod Polst-Cpm Polst Er     REACTION: Can't wake up  . Losartan Potassium-Hctz     REACTION: swelling in feet and legs  . Sertraline Hcl     REACTION: Tired/  sleepy  . Sulfonamide Derivatives   . Citalopram     Made her sleepy    Past Medical History  Diagnosis Date  . Anxiety   . Hyperlipidemia   . Hypertension   . Disorder of bone and cartilage, unspecified   . Allergy   . GERD (gastroesophageal reflux disease)   . ASCVD (arteriosclerotic cardiovascular disease)   . Breast tumor     left, benign  . Cataract   . Tubular adenoma of colon 6/14    Dr Mechele Collin    No past surgical history on file.  Family History  Problem Relation Age of Onset  . Hypertension Mother   . Cancer Sister     lung cancer  . Diabetes Brother     History   Social History  . Marital Status: Married    Spouse Name: N/A    Number of Children: 4  . Years of Education: N/A   Occupational History  . 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 Smoking status: Current Every Day Smoker  . Smokeless tobacco: Never Used  . Alcohol Use: No  . Drug Use: No  . Sexual Activity: Not on file   Other Topics  Concern  . Not on file   Social History Narrative  . No narrative on file   Review of Systems No rash No vomiting or diarrhea Appetite is up a bit    Objective:   Physical Exam  Constitutional: She appears well-developed and well-nourished. No distress.  HENT:  Mouth/Throat: Oropharynx is clear and moist. No oropharyngeal exudate.  Moderate pale nasal congestion TMs normal  Eyes:  Mild pale tarsal injection  Neck: Normal range of motion. Neck supple.  Pulmonary/Chest: Effort normal and breath sounds normal. No respiratory distress. She has no wheezes. She has no rales.  Lymphadenopathy:    She has no cervical adenopathy.          Assessment & Plan:

## 2013-09-16 NOTE — Patient Instructions (Signed)
Please try plain cetirizine 10mg  daily (or even twice a day) for your sinus symptoms. If you get worse next week, call for trial of an antibiotic

## 2013-09-16 NOTE — Addendum Note (Signed)
Addended by: Sueanne Margarita on: 09/16/2013 12:01 PM   Modules accepted: Orders

## 2013-09-16 NOTE — Assessment & Plan Note (Addendum)
With eye symptoms also Seems to be allergic Discussed increasing the cetirizine dose (not using the D)  If worsens next week, would try empiric antibiotic

## 2013-09-28 ENCOUNTER — Encounter: Payer: Self-pay | Admitting: Internal Medicine

## 2013-09-28 ENCOUNTER — Ambulatory Visit (INDEPENDENT_AMBULATORY_CARE_PROVIDER_SITE_OTHER): Payer: Medicare Other | Admitting: Internal Medicine

## 2013-09-28 VITALS — BP 150/70 | HR 78 | Temp 97.5°F | Ht 61.0 in | Wt 110.0 lb

## 2013-09-28 DIAGNOSIS — I2510 Atherosclerotic heart disease of native coronary artery without angina pectoris: Secondary | ICD-10-CM

## 2013-09-28 DIAGNOSIS — F411 Generalized anxiety disorder: Secondary | ICD-10-CM

## 2013-09-28 DIAGNOSIS — E785 Hyperlipidemia, unspecified: Secondary | ICD-10-CM

## 2013-09-28 DIAGNOSIS — I10 Essential (primary) hypertension: Secondary | ICD-10-CM

## 2013-09-28 DIAGNOSIS — Z0000 Encounter for general adult medical examination without abnormal findings: Secondary | ICD-10-CM

## 2013-09-28 LAB — TSH: TSH: 1.46 u[IU]/mL (ref 0.35–5.50)

## 2013-09-28 LAB — CBC WITH DIFFERENTIAL/PLATELET
Basophils Relative: 0.7 % (ref 0.0–3.0)
Eosinophils Relative: 2.8 % (ref 0.0–5.0)
Hemoglobin: 11.9 g/dL — ABNORMAL LOW (ref 12.0–15.0)
Lymphocytes Relative: 33.9 % (ref 12.0–46.0)
MCV: 88.3 fl (ref 78.0–100.0)
Neutro Abs: 5.2 10*3/uL (ref 1.4–7.7)
Neutrophils Relative %: 56 % (ref 43.0–77.0)
Platelets: 315 10*3/uL (ref 150.0–400.0)
RBC: 4.04 Mil/uL (ref 3.87–5.11)
WBC: 9.3 10*3/uL (ref 4.5–10.5)

## 2013-09-28 LAB — BASIC METABOLIC PANEL
Calcium: 9.6 mg/dL (ref 8.4–10.5)
Chloride: 97 mEq/L (ref 96–112)
Creatinine, Ser: 1.2 mg/dL (ref 0.4–1.2)
GFR: 46.17 mL/min — ABNORMAL LOW (ref >60.00)

## 2013-09-28 LAB — HEPATIC FUNCTION PANEL
ALT: 12 U/L (ref 0–35)
AST: 21 U/L (ref 0–37)
Bilirubin, Direct: 0 mg/dL (ref 0.0–0.3)
Total Bilirubin: 0.6 mg/dL (ref 0.3–1.2)

## 2013-09-28 LAB — LIPID PANEL
HDL: 50.3 mg/dL (ref >39.00)
LDL Cholesterol: 84 mg/dL (ref 0–99)
Total CHOL/HDL Ratio: 3
Triglycerides: 100 mg/dL (ref 0.0–149.0)
VLDL: 20 mg/dL (ref 0.0–40.0)

## 2013-09-28 MED ORDER — TRAMADOL HCL 50 MG PO TABS: 50.0000 mg | ORAL_TABLET | Freq: Two times a day (BID) | ORAL | Status: DC | PRN

## 2013-09-28 NOTE — Assessment & Plan Note (Signed)
On statin due to known vascular disease Still sees Dr Allyson Sabal

## 2013-09-28 NOTE — Assessment & Plan Note (Signed)
Doesn't want Td or mammogram

## 2013-09-28 NOTE — Assessment & Plan Note (Signed)
BP Readings from Last 3 Encounters:  09/28/13 150/70  09/16/13 158/88  04/02/13 150/60   Reasonable control for her age No changes

## 2013-09-28 NOTE — Progress Notes (Signed)
Subjective:    Patient ID: Paige Ortiz, female    DOB: 12/03/1933, 79 y.o.   MRN: [id number]  HPI Here for physical Reviewed advanced directives Still some sinus congestion---continues on the zyrtec Doesn't want any cancer screening---no more mammograms Discussed cigarette smoking--she knows she should quit but is just not ready. None in house or car Tried hypnosis and patch---but restarted  Ongoing stress Anxiety continues--usually uses less than 2 per day Always uses to help for sleep Also uses the tramadol for sleep--helps relax her chronic pain  Current Outpatient Prescriptions on File Prior to Visit  Medication Sig Dispense Refill  . ALPRAZolam (XANAX) 0.5 MG tablet Take 1 tablet (0.5 mg total) by mouth 2 (two) times daily as needed.  60 tablet  0  . amLODipine (NORVASC) 5 MG tablet Take 1 tablet (5 mg total) by mouth daily.  30 tablet  5  . aspirin 81 MG tablet Take 81 mg by mouth daily.        . celecoxib (CELEBREX) 200 MG capsule Take 1 capsule (200 mg total) by mouth daily.  30 capsule  11  . cholecalciferol (VITAMIN D) 1000 UNITS tablet Take 1,000 Units by mouth daily.      . clopidogrel (PLAVIX) 75 MG tablet Take 1 tablet (75 mg total) by mouth daily.  30 tablet  6  . simvastatin (ZOCOR) 20 MG tablet Take 1 tablet (20 mg total) by mouth every evening.  30 tablet  11  . traMADol (ULTRAM) 50 MG tablet Take 50 mg by mouth as needed.        No current facility-administered medications on file prior to visit.    Allergies  Allergen Reactions  . Clindamycin/Lincomycin Hives  . Bupropion Hcl     REACTION: Suicidal thoughts  . Hydrocod Polst-Cpm Polst Er     REACTION: Can't wake up  . Losartan Potassium-Hctz     REACTION: swelling in feet and legs  . Sertraline Hcl     REACTION: Tired/ sleepy  . Sulfonamide Derivatives   . Citalopram     Made her sleepy    Past Medical History  Diagnosis Date  . Anxiety   . Hyperlipidemia   . Hypertension   . Disorder of bone  and cartilage, unspecified   . Allergy   . GERD (gastroesophageal reflux disease)   . ASCVD (arteriosclerotic cardiovascular disease)   . Breast tumor     left, benign  . Cataract   . Tubular adenoma of colon 6/14    Dr Mechele Collin    No past surgical history on file.  Family History  Problem Relation Age of Onset  . Hypertension Mother   . Cancer Sister     lung cancer  . Diabetes Brother   . Heart disease Brother     MI  . Frontotemporal dementia Brother     History   Social History  . Marital Status: Married    Spouse Name: N/A    Number of Children: 4  . Years of Education: N/A   Occupational History  . 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 Smoking status: Current Every Day Smoker  . Smokeless tobacco: Never Used  . Alcohol Use: No  . Drug Use: No  . Sexual Activity: Not on file   Other Topics Concern  . Not on file   Social History Narrative   No living will   Requests her daughter, Aram Beecham, as health care POA.  Discussed that she should do the formal paperwork since she prefers her husband not do this (as he will keep her on life support)   Would accept resuscitation attempts but no prolonged artificial ventilation.   No tube feeding if cognitively unaware   Review of Systems  Constitutional: Negative for fatigue and unexpected weight change.       Wears seat belt  HENT: Positive for congestion and rhinorrhea. Negative for dental problem, hearing loss and tinnitus.   Eyes: Negative for visual disturbance.       No diplopia or unilateral vision loss  Respiratory: Negative for cough, chest tightness and shortness of breath.   Cardiovascular: Positive for palpitations and leg swelling. Negative for chest pain.       Palpitations with anxiety Occasional PM leg swelling  Gastrointestinal: Negative for nausea, abdominal pain, constipation and blood in stool.       Has hiatal hernia---ranitidine and behavioral measures control  this  Endocrine: Positive for cold intolerance.       Cold intolerance from the plavix  Genitourinary: Positive for frequency. Negative for dysuria, hematuria and difficulty urinating.  Musculoskeletal: Positive for arthralgias. Negative for back pain and joint swelling.  Skin: Negative for rash.       No suspicious lesions---follows with dermatologist  Allergic/Immunologic: Positive for environmental allergies. Negative for immunocompromised state.  Neurological: Negative for dizziness, syncope, weakness, light-headedness, numbness and headaches.  Hematological: Negative for adenopathy. Bruises/bleeds easily.  Psychiatric/Behavioral: Negative for sleep disturbance and dysphoric mood. The patient is nervous/anxious.        Objective:   Physical Exam  Constitutional: She is oriented to person, place, and time. She appears well-developed and well-nourished. No distress.  HENT:  Head: Normocephalic and atraumatic.  Right Ear: External ear normal.  Left Ear: External ear normal.  Mouth/Throat: Oropharynx is clear and moist. No oropharyngeal exudate.  Eyes: Conjunctivae and EOM are normal. Pupils are equal, round, and reactive to light.  Neck: Normal range of motion. Neck supple. No thyromegaly present.  Cardiovascular: Normal rate, regular rhythm and intact distal pulses.  Exam reveals no gallop.   Very faint systolic murmur in tricuspid area  Pulmonary/Chest: Effort normal and breath sounds normal. No respiratory distress. She has no wheezes. She has no rales.  Abdominal: Soft. There is no tenderness.  Musculoskeletal: She exhibits no edema and no tenderness.  Lymphadenopathy:    She has no cervical adenopathy.  Neurological: She is alert and oriented to person, place, and time.  Skin: No rash noted. No erythema.  Multiple seb keratoses  Psychiatric: She has a normal mood and affect. Her behavior is normal.  Usually anxiety but no worse          Assessment & Plan:

## 2013-09-28 NOTE — Assessment & Plan Note (Signed)
Ongoing stress--usually from family issues Continue meds

## 2013-09-28 NOTE — Assessment & Plan Note (Signed)
Past carotid surgery

## 2013-09-30 ENCOUNTER — Encounter: Payer: Self-pay | Admitting: *Deleted

## 2013-10-02 ENCOUNTER — Telehealth: Payer: Self-pay

## 2013-10-02 ENCOUNTER — Other Ambulatory Visit: Payer: Self-pay | Admitting: *Deleted

## 2013-10-02 MED ORDER — ALPRAZOLAM 0.5 MG PO TABS: 0.5000 mg | ORAL_TABLET | Freq: Two times a day (BID) | ORAL | Status: DC | PRN

## 2013-10-02 NOTE — Telephone Encounter (Signed)
Okay #60 x 0 

## 2013-10-02 NOTE — Telephone Encounter (Signed)
Pt request refill tramadol to [facility]. Advised pt should have gotten written rx at 09/28/13 visit; pt checked and found rx.

## 2013-10-02 NOTE — Telephone Encounter (Signed)
Last filled 08/13/13

## 2013-10-02 NOTE — Telephone Encounter (Signed)
rx called into pharmacy

## 2013-10-20 ENCOUNTER — Telehealth: Payer: Self-pay

## 2013-10-20 MED ORDER — AMOXICILLIN 500 MG PO TABS: 500.0000 mg | ORAL_TABLET | Freq: Two times a day (BID) | ORAL | Status: DC

## 2013-10-20 NOTE — Telephone Encounter (Signed)
If she has ongoing symptoms, antibiotic Rx may be appropriate Z-pak is not best choice  I recommend amoxicillin 500mg   2 tabs bid #40 x 0 Okay to send if she is okay with this

## 2013-10-20 NOTE — Telephone Encounter (Signed)
rx sent to pharmacy by e-script Left message on VM with results, advised pt to call if any questions

## 2013-10-20 NOTE — Telephone Encounter (Signed)
Pt was seen 09/28/13 and had some head congestion then, now head congestion is worse, h/a on and off,not coughing a lot but when does cough has prod cough with green phlegm,no fever, SOB or wheezing. Pt request z pack sent to [facility].Please advise.

## 2013-10-23 ENCOUNTER — Other Ambulatory Visit: Payer: Self-pay | Admitting: *Deleted

## 2013-10-23 MED ORDER — VALSARTAN-HYDROCHLOROTHIAZIDE 80-12.5 MG PO TABS: 1.0000 | ORAL_TABLET | Freq: Every day | ORAL | Status: DC

## 2013-10-29 ENCOUNTER — Ambulatory Visit (INDEPENDENT_AMBULATORY_CARE_PROVIDER_SITE_OTHER): Payer: Medicare Other | Admitting: Cardiovascular Disease

## 2013-10-29 ENCOUNTER — Encounter: Payer: Self-pay | Admitting: Cardiovascular Disease

## 2013-10-29 VITALS — BP 150/60 | HR 65 | Ht 61.0 in | Wt 111.0 lb

## 2013-10-29 DIAGNOSIS — I779 Disorder of arteries and arterioles, unspecified: Secondary | ICD-10-CM

## 2013-10-29 DIAGNOSIS — I2510 Atherosclerotic heart disease of native coronary artery without angina pectoris: Secondary | ICD-10-CM

## 2013-10-29 DIAGNOSIS — E785 Hyperlipidemia, unspecified: Secondary | ICD-10-CM

## 2013-10-29 DIAGNOSIS — I739 Peripheral vascular disease, unspecified: Secondary | ICD-10-CM | POA: Insufficient documentation

## 2013-10-29 DIAGNOSIS — I10 Essential (primary) hypertension: Secondary | ICD-10-CM

## 2013-10-29 NOTE — Patient Instructions (Signed)
Your physician wants you to follow-up in: 1 year with Dr Allyson Sabal. You will receive a reminder letter in the mail two months in advance. If you don't receive a letter, please call our office to schedule the follow-up appointment.  Dr Allyson Sabal has ordered a lower extremity arterial doppler and carotid doppler in Jan 2015

## 2013-10-29 NOTE — Progress Notes (Signed)
10/29/2013 Paige Ortiz   Aug 03, 1933  [id number]  Primary Physician Tillman Abide, MD Primary Cardiologist: Runell Gess MD Paige Ortiz   HPI:  The patient is a very pleasant, 79 year old, thin-appearing, married Caucasian female whose husband is also a patient of mine. She is a mother of 3, grandmother to 3 grandchildren. I saw her a year ago. She is status post left carotid endarterectomy performed by Dr. Marcy Panning June 23, 2004, for stenosis that I found by angiography. We follow her Dopplers on an annual basis. I have also done PTA and stenting of her distal abdominal aorta and bilateral iliac arteries September 2005 with complete resolution of her claudication and normalization of her Dopplers. Her other problems include continued tobacco abuse 1 pack per day, hypertension and hyperlipidemia. She is completely asymptomatic. Her last stress test was 3years ago and was nonischemic. Saw her she has been asymptomatic and specifically denies chest pain or shortness of breath. She has not had lower extremity or carotid Dopplers in over a year.    Current Outpatient Prescriptions  Medication Sig Dispense Refill  . ALPRAZolam (XANAX) 0.5 MG tablet Take 1 tablet (0.5 mg total) by mouth 2 (two) times daily as needed.  60 tablet  0  . amLODipine (NORVASC) 5 MG tablet Take 1 tablet (5 mg total) by mouth daily.  30 tablet  5  . amoxicillin (AMOXIL) 500 MG tablet Take 1 tablet (500 mg total) by mouth 2 (two) times daily.  40 tablet  0  . aspirin 81 MG tablet Take 81 mg by mouth daily.        . celecoxib (CELEBREX) 200 MG capsule Take 1 capsule (200 mg total) by mouth daily.  30 capsule  11  . cholecalciferol (VITAMIN D) 1000 UNITS tablet Take 1,000 Units by mouth daily.      . clopidogrel (PLAVIX) 75 MG tablet Take 1 tablet (75 mg total) by mouth daily.  30 tablet  6  . ranitidine (ZANTAC) 150 MG tablet Take 150 mg by mouth daily.       . simvastatin (ZOCOR) 20 MG tablet Take  1 tablet (20 mg total) by mouth every evening.  30 tablet  11  . traMADol (ULTRAM) 50 MG tablet Take 1 tablet (50 mg total) by mouth 2 (two) times daily as needed for pain.  60 tablet  0  . valsartan-hydrochlorothiazide (DIOVAN HCT) 80-12.5 MG per tablet Take 1 tablet by mouth daily.  90 tablet  3   No current facility-administered medications for this visit.    Allergies  Allergen Reactions  . Clindamycin/Lincomycin Hives  . Bupropion Hcl     REACTION: Suicidal thoughts  . Hydrocod Polst-Cpm Polst Er     REACTION: Can't wake up  . Losartan Potassium-Hctz     REACTION: swelling in feet and legs  . Sertraline Hcl     REACTION: Tired/ sleepy  . Sulfonamide Derivatives   . Citalopram     Made her sleepy    History   Social History  . Marital Status: Married    Spouse Name: N/A    Number of Children: 4  . Years of Education: N/A   Occupational History  . 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 Smoking status: Current Every Day Smoker  . Smokeless tobacco: Never Used  . Alcohol Use: No  . Drug Use: No  . Sexual Activity: Not on file   Other Topics  Concern  . Not on file   Social History Narrative   No living will   Requests her daughter, Paige Ortiz, as health care POA. Discussed that she should do the formal paperwork since she prefers her husband not do this (as he will keep her on life support)   Would accept resuscitation attempts but no prolonged artificial ventilation.   No tube feeding if cognitively unaware     Review of Systems: General: negative for chills, fever, night sweats or weight changes.  Cardiovascular: negative for chest pain, dyspnea on exertion, edema, orthopnea, palpitations, paroxysmal nocturnal dyspnea or shortness of breath Dermatological: negative for rash Respiratory: negative for cough or wheezing Urologic: negative for hematuria Abdominal: negative for nausea, vomiting, diarrhea, bright red blood per rectum,  melena, or hematemesis Neurologic: negative for visual changes, syncope, or dizziness All other systems reviewed and are otherwise negative except as noted above.    Blood pressure 150/60, pulse 65, height 5\' 1"  (1.549 m), weight 111 lb (50.349 kg).  General appearance: alert and no distress Neck: no adenopathy, no JVD, supple, symmetrical, trachea midline, thyroid not enlarged, symmetric, no tenderness/mass/nodules and loud bilateral carotid bruits Lungs: clear to auscultation bilaterally Heart: regular rate and rhythm, S1, S2 normal, no murmur, click, rub or gallop Extremities: extremities normal, atraumatic, no cyanosis or edema and femoral femoral arteries are 2+ without bruits and she has intact pedal pulses  EKG normal sinus rhythm at 66 without ST or T wave changes  ASSESSMENT AND PLAN:   Peripheral arterial disease Status post stenting of her distal abdominal aorta and bilateral iliac arteries by myself September 2005 with complete resolution lesion of her claudication and normalization of her Dopplers. She denies claudication. She has not had duplex followup in over a year which we will recheck.  Carotid artery disease Status post left carotid endarterectomy performed by Dr. Zeb Comfort on 06/23/04. She had carotid Dopplers studies performed over a year ago which did not show any significant stenosis. She has loud bilateral bruits today. We will repeat her carotid Doppler studies  HYPERLIPIDEMIA On statin therapy followed by Dr. Orlean Bradford. Labs was performed 09/30/13 revealed a total cholesterol of 154, LDL 84 HDL of 50  HYPERTENSION Well-controlled on current medications      Runell Gess MD [facility], [place], [facility] 10/29/2013 11:19 AM

## 2013-10-29 NOTE — Assessment & Plan Note (Signed)
Well-controlled on current medications 

## 2013-10-29 NOTE — Assessment & Plan Note (Signed)
Status post left carotid endarterectomy performed by Dr. Zeb Comfort on 06/23/04. She had carotid Dopplers studies performed over a year ago which did not show any significant stenosis. She has loud bilateral bruits today. We will repeat her carotid Doppler studies

## 2013-10-29 NOTE — Assessment & Plan Note (Signed)
On statin therapy followed by Dr. Orlean Bradford. Labs was performed 09/30/13 revealed a total cholesterol of 154, LDL 84 HDL of 50

## 2013-10-29 NOTE — Assessment & Plan Note (Signed)
Status post stenting of her distal abdominal aorta and bilateral iliac arteries by myself September 2005 with complete resolution lesion of her claudication and normalization of her Dopplers. She denies claudication. She has not had duplex followup in over a year which we will recheck.

## 2013-11-17 ENCOUNTER — Other Ambulatory Visit: Payer: Self-pay | Admitting: *Deleted

## 2013-11-17 MED ORDER — ALPRAZOLAM 0.5 MG PO TABS: 0.5000 mg | ORAL_TABLET | Freq: Two times a day (BID) | ORAL | Status: DC | PRN

## 2013-11-17 MED ORDER — TRAMADOL HCL 50 MG PO TABS: 50.0000 mg | ORAL_TABLET | Freq: Two times a day (BID) | ORAL | Status: DC | PRN

## 2013-11-17 NOTE — Telephone Encounter (Addendum)
Received faxed refill request from pharmacy. Last office visit 09/28/13, last refill 10/02/13 #60 on Alprazolam and 09/28/13 on Tramadol #60.. Is it okay to refill medications?

## 2013-11-17 NOTE — Telephone Encounter (Signed)
rx called into pharmacy

## 2013-11-17 NOTE — Telephone Encounter (Signed)
Okay #60 x 0 for each 

## 2013-11-25 ENCOUNTER — Telehealth (HOSPITAL_COMMUNITY): Payer: Self-pay | Admitting: *Deleted

## 2013-11-27 ENCOUNTER — Telehealth (HOSPITAL_COMMUNITY): Payer: Self-pay | Admitting: *Deleted

## 2013-11-30 ENCOUNTER — Telehealth (HOSPITAL_COMMUNITY): Payer: Self-pay | Admitting: *Deleted

## 2013-12-11 ENCOUNTER — Other Ambulatory Visit (HOSPITAL_COMMUNITY): Payer: Self-pay | Admitting: Cardiovascular Disease

## 2013-12-11 DIAGNOSIS — I739 Peripheral vascular disease, unspecified: Secondary | ICD-10-CM

## 2013-12-18 ENCOUNTER — Ambulatory Visit (HOSPITAL_COMMUNITY)
Admission: RE | Admit: 2013-12-18 | Discharge: 2013-12-18 | Disposition: A | Discharge status: Home / Self Care | Payer: Medicare Other | Source: Ambulatory Visit | Attending: Cardiovascular Disease | Admitting: Cardiovascular Disease

## 2013-12-18 DIAGNOSIS — I6529 Occlusion and stenosis of unspecified carotid artery: Secondary | ICD-10-CM

## 2013-12-18 DIAGNOSIS — I779 Disorder of arteries and arterioles, unspecified: Secondary | ICD-10-CM

## 2013-12-18 DIAGNOSIS — I672 Cerebral atherosclerosis: Secondary | ICD-10-CM | POA: Insufficient documentation

## 2013-12-18 DIAGNOSIS — R0989 Other specified symptoms and signs involving the circulatory and respiratory systems: Secondary | ICD-10-CM

## 2013-12-18 DIAGNOSIS — I739 Peripheral vascular disease, unspecified: Secondary | ICD-10-CM

## 2013-12-18 NOTE — Progress Notes (Signed)
Carotid duplex completed. [place]

## 2013-12-21 ENCOUNTER — Encounter: Payer: Self-pay | Admitting: Radiology

## 2013-12-22 ENCOUNTER — Ambulatory Visit (INDEPENDENT_AMBULATORY_CARE_PROVIDER_SITE_OTHER): Payer: Medicare Other | Admitting: Internal Medicine

## 2013-12-22 ENCOUNTER — Encounter: Payer: Self-pay | Admitting: Internal Medicine

## 2013-12-22 VITALS — BP 158/80 | HR 70 | Temp 98.1°F | Wt 108.0 lb

## 2013-12-22 DIAGNOSIS — J209 Acute bronchitis, unspecified: Secondary | ICD-10-CM

## 2013-12-22 MED ORDER — PREDNISONE 20 MG PO TABS: 40.0000 mg | ORAL_TABLET | Freq: Every day | ORAL | Status: DC

## 2013-12-22 MED ORDER — AZITHROMYCIN 250 MG PO TABS: ORAL_TABLET | ORAL | Status: DC

## 2013-12-22 NOTE — Patient Instructions (Signed)

## 2013-12-22 NOTE — Progress Notes (Signed)
Pre-visit discussion using our clinic review tool. No additional management support is needed unless otherwise documented below in the visit note.  

## 2013-12-22 NOTE — Progress Notes (Signed)
Subjective:    Patient ID: Paige Ortiz, female    DOB: 03/10/1933, 80 y.o.   MRN: [id number]  HPI Had sinus infection in the fall Has been in and out in the weather Then started taking cold around Christmas Chest congestion now Head congestion is some better  Has soreness under strenum--with cough Has fullness there No real SOB or just a little No fever Stays cold but no shakes or sweats  Trying the flonase No other meds besides tylenol  Current Outpatient Prescriptions on File Prior to Visit  Medication Sig Dispense Refill  . ALPRAZolam (XANAX) 0.5 MG tablet Take 1 tablet (0.5 mg total) by mouth 2 (two) times daily as needed.  60 tablet  0  . amLODipine (NORVASC) 5 MG tablet Take 1 tablet (5 mg total) by mouth daily.  30 tablet  5  . aspirin 81 MG tablet Take 81 mg by mouth daily.        . celecoxib (CELEBREX) 200 MG capsule Take 1 capsule (200 mg total) by mouth daily.  30 capsule  11  . cholecalciferol (VITAMIN D) 1000 UNITS tablet Take 1,000 Units by mouth daily.      . clopidogrel (PLAVIX) 75 MG tablet Take 1 tablet (75 mg total) by mouth daily.  30 tablet  6  . ranitidine (ZANTAC) 150 MG tablet Take 150 mg by mouth daily.       . simvastatin (ZOCOR) 20 MG tablet Take 1 tablet (20 mg total) by mouth every evening.  30 tablet  11  . traMADol (ULTRAM) 50 MG tablet Take 1 tablet (50 mg total) by mouth 2 (two) times daily as needed.  60 tablet  0  . valsartan-hydrochlorothiazide (DIOVAN HCT) 80-12.5 MG per tablet Take 1 tablet by mouth daily.  90 tablet  3   No current facility-administered medications on file prior to visit.    Allergies  Allergen Reactions  . Clindamycin/Lincomycin Hives  . Bupropion Hcl     REACTION: Suicidal thoughts  . Hydrocod Polst-Cpm Polst Er     REACTION: Can't wake up  . Losartan Potassium-Hctz     REACTION: swelling in feet and legs  . Sertraline Hcl     REACTION: Tired/ sleepy  . Sulfonamide Derivatives   . Citalopram     Made her  sleepy    Past Medical History  Diagnosis Date  . Anxiety   . Hyperlipidemia   . Hypertension   . Disorder of bone and cartilage, unspecified   . Allergy   . GERD (gastroesophageal reflux disease)   . ASCVD (arteriosclerotic cardiovascular disease)   . Breast tumor     left, benign  . Cataract   . Tubular adenoma of colon 6/14    Dr Vira Agar  . Tobacco abuse     Past Surgical History  Procedure Laterality Date  . Carotid endarterectomy    . Descending aortic aneurysm repair w/ stent      Family History  Problem Relation Age of Onset  . Hypertension Mother   . Cancer Sister     lung cancer  . Diabetes Brother   . Heart disease Brother     MI  . Frontotemporal dementia Brother     History   Social History  . Marital Status: Married    Spouse Name: N/A    Number of Children: 4  . Years of Education: N/A   Occupational History  . 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 Smoking status: Current Every Day Smoker  . Smokeless tobacco: Never Used  . Alcohol Use: No  . Drug Use: No  . Sexual Activity: Not on file   Other Topics Concern  . Not on file   Social History Narrative   No living will   Requests her daughter, Caren Griffins, as health care POA. Discussed that she should do the formal paperwork since she prefers her husband not do this (as he will keep her on life support)   Would accept resuscitation attempts but no prolonged artificial ventilation.   No tube feeding if cognitively unaware   Review of Systems Able to sleep okay Recent check up with Dr Gwenlyn Found was okay    Objective:   Physical Exam  Constitutional: She appears well-developed and well-nourished. No distress.  HENT:  Mouth/Throat: Oropharynx is clear and moist. No oropharyngeal exudate.  No sinus tenderness TMs normal Mild nasal congestion  Neck: Normal range of motion. Neck supple.  Pulmonary/Chest: Effort normal. No respiratory distress. She has no  rales.  Slight whistles and exp wheeze Not very tight though  Lymphadenopathy:    She has no cervical adenopathy.          Assessment & Plan:

## 2013-12-22 NOTE — Assessment & Plan Note (Signed)
Goes back over 2 weeks Some tightness in chest Will treat with z-pak 3 days of prednisone

## 2013-12-23 ENCOUNTER — Telehealth: Payer: Self-pay | Admitting: *Deleted

## 2013-12-23 DIAGNOSIS — I6529 Occlusion and stenosis of unspecified carotid artery: Secondary | ICD-10-CM

## 2013-12-23 NOTE — Telephone Encounter (Signed)
Message copied by Chauncy Lean on Wed Dec 23, 2013 12:53 PM ------      Message from: Lorretta Harp      Created: Wed Dec 23, 2013  9:49 AM       Mild changes noted. Repeat in 6 months ------

## 2013-12-23 NOTE — Telephone Encounter (Signed)
Order placed for repeat carotid dopplers in 6 months  

## 2014-01-06 ENCOUNTER — Other Ambulatory Visit: Payer: Self-pay | Admitting: *Deleted

## 2014-01-06 MED ORDER — TRAMADOL HCL 50 MG PO TABS: 50.0000 mg | ORAL_TABLET | Freq: Two times a day (BID) | ORAL | Status: DC | PRN

## 2014-01-06 MED ORDER — ALPRAZOLAM 0.5 MG PO TABS: 0.5000 mg | ORAL_TABLET | Freq: Two times a day (BID) | ORAL | Status: DC | PRN

## 2014-01-06 NOTE — Telephone Encounter (Signed)
rx called into pharmacy

## 2014-01-06 NOTE — Telephone Encounter (Signed)
11/17/13 

## 2014-01-06 NOTE — Telephone Encounter (Signed)
Okay #60 x 0 for each 

## 2014-01-09 ENCOUNTER — Telehealth: Payer: Self-pay | Admitting: Internal Medicine

## 2014-01-09 NOTE — Telephone Encounter (Signed)
Relevant patient education mailed to patient.  

## 2014-01-27 ENCOUNTER — Other Ambulatory Visit: Payer: Self-pay | Admitting: *Deleted

## 2014-01-27 MED ORDER — CELECOXIB 200 MG PO CAPS: 200.0000 mg | ORAL_CAPSULE | Freq: Every day | ORAL | Status: DC

## 2014-01-29 ENCOUNTER — Other Ambulatory Visit: Payer: Self-pay | Admitting: *Deleted

## 2014-01-29 MED ORDER — CLOPIDOGREL BISULFATE 75 MG PO TABS: 75.0000 mg | ORAL_TABLET | Freq: Every day | ORAL | Status: DC

## 2014-01-29 NOTE — Telephone Encounter (Signed)
Rx was sent to pharmacy electronically. 

## 2014-02-19 ENCOUNTER — Other Ambulatory Visit: Payer: Self-pay | Admitting: *Deleted

## 2014-02-19 NOTE — Telephone Encounter (Signed)
#  60/0 filled on 01/06/14.

## 2014-02-20 MED ORDER — ALPRAZOLAM 0.5 MG PO TABS: 0.5000 mg | ORAL_TABLET | Freq: Two times a day (BID) | ORAL | Status: DC | PRN

## 2014-02-20 NOTE — Telephone Encounter (Signed)
Please call in

## 2014-02-22 NOTE — Telephone Encounter (Signed)
Medication phoned to pharmacy.  

## 2014-03-03 ENCOUNTER — Other Ambulatory Visit: Payer: Self-pay | Admitting: *Deleted

## 2014-03-03 MED ORDER — AMLODIPINE BESYLATE 5 MG PO TABS: 5.0000 mg | ORAL_TABLET | Freq: Every day | ORAL | Status: DC

## 2014-03-15 ENCOUNTER — Other Ambulatory Visit: Payer: Self-pay | Admitting: *Deleted

## 2014-03-15 NOTE — Telephone Encounter (Signed)
Okay #60 x 0 

## 2014-03-15 NOTE — Telephone Encounter (Signed)
Faxed refill request. CPE 09/28/13.  Last filled:  60 tablets 0 RF on 01/06/2014.  Please advise.

## 2014-03-16 MED ORDER — TRAMADOL HCL 50 MG PO TABS: 50.0000 mg | ORAL_TABLET | Freq: Two times a day (BID) | ORAL | Status: DC | PRN

## 2014-03-16 NOTE — Telephone Encounter (Signed)
rx called into pharmacy

## 2014-03-29 ENCOUNTER — Other Ambulatory Visit: Payer: Self-pay | Admitting: *Deleted

## 2014-03-29 MED ORDER — ALPRAZOLAM 0.5 MG PO TABS: 0.5000 mg | ORAL_TABLET | Freq: Two times a day (BID) | ORAL | Status: DC | PRN

## 2014-03-29 NOTE — Telephone Encounter (Signed)
rx called into pharmacy

## 2014-03-29 NOTE — Telephone Encounter (Signed)
Okay #60 x 0 

## 2014-03-29 NOTE — Telephone Encounter (Signed)
#   60 last filled 02/22/14.

## 2014-03-30 ENCOUNTER — Encounter: Payer: Self-pay | Admitting: Internal Medicine

## 2014-03-30 ENCOUNTER — Encounter: Payer: Self-pay | Admitting: Radiology

## 2014-03-30 ENCOUNTER — Ambulatory Visit (INDEPENDENT_AMBULATORY_CARE_PROVIDER_SITE_OTHER): Payer: Medicare Other | Admitting: Internal Medicine

## 2014-03-30 VITALS — BP 158/68 | HR 65 | Temp 98.1°F | Wt 111.0 lb

## 2014-03-30 DIAGNOSIS — E785 Hyperlipidemia, unspecified: Secondary | ICD-10-CM

## 2014-03-30 DIAGNOSIS — I739 Peripheral vascular disease, unspecified: Secondary | ICD-10-CM

## 2014-03-30 DIAGNOSIS — I10 Essential (primary) hypertension: Secondary | ICD-10-CM

## 2014-03-30 DIAGNOSIS — F411 Generalized anxiety disorder: Secondary | ICD-10-CM

## 2014-03-30 NOTE — Progress Notes (Signed)
Subjective:    Patient ID: Paige Ortiz, female    DOB: 1933-02-16, 80 y.o.   MRN: [id number]  HPI Doing okay "as well as I can be" Ongoing family stress--brother's frontal lobe dementia has worsened Has some help from caregivers Looking for dementia care placement Niece died of the breast cancer Uses the alprazolam every night, and the tramadol---to help her sleep. Usually can get by in day without it--but if anxiety bad, she will take 1/2 alprazolam  Recent check up with Dr Gwenlyn Found Carotids rechecked---looked okay No chest pain No SOB   Having some mid back pain and at lower esophagus---feels like a golf ball there 1/2 xanax will help that Continues on the zantac  Current Outpatient Prescriptions on File Prior to Visit  Medication Sig Dispense Refill  . ALPRAZolam (XANAX) 0.5 MG tablet Take 1 tablet (0.5 mg total) by mouth 2 (two) times daily as needed.  60 tablet  0  . amLODipine (NORVASC) 5 MG tablet Take 1 tablet (5 mg total) by mouth daily.  30 tablet  5  . aspirin 81 MG tablet Take 81 mg by mouth daily.        . celecoxib (CELEBREX) 200 MG capsule Take 1 capsule (200 mg total) by mouth daily.  30 capsule  11  . cholecalciferol (VITAMIN D) 1000 UNITS tablet Take 1,000 Units by mouth daily.      . clopidogrel (PLAVIX) 75 MG tablet Take 1 tablet (75 mg total) by mouth daily.  30 tablet  6  . ranitidine (ZANTAC) 150 MG tablet Take 150 mg by mouth daily.       . simvastatin (ZOCOR) 20 MG tablet Take 1 tablet (20 mg total) by mouth every evening.  30 tablet  11  . traMADol (ULTRAM) 50 MG tablet Take 1 tablet (50 mg total) by mouth 2 (two) times daily as needed.  60 tablet  0  . valsartan-hydrochlorothiazide (DIOVAN HCT) 80-12.5 MG per tablet Take 1 tablet by mouth daily.  90 tablet  3   No current facility-administered medications on file prior to visit.    Allergies  Allergen Reactions  . Clindamycin/Lincomycin Hives  . Bupropion Hcl     REACTION: Suicidal thoughts  .  Hydrocod Polst-Cpm Polst Er     REACTION: Can't wake up  . Losartan Potassium-Hctz     REACTION: swelling in feet and legs  . Sertraline Hcl     REACTION: Tired/ sleepy  . Sulfonamide Derivatives   . Citalopram     Made her sleepy    Past Medical History  Diagnosis Date  . Anxiety   . Hyperlipidemia   . Hypertension   . Disorder of bone and cartilage, unspecified   . Allergy   . GERD (gastroesophageal reflux disease)   . ASCVD (arteriosclerotic cardiovascular disease)   . Breast tumor     left, benign  . Cataract   . Tubular adenoma of colon 6/14    Dr Vira Agar  . Tobacco abuse     Past Surgical History  Procedure Laterality Date  . Carotid endarterectomy    . Descending aortic aneurysm repair w/ stent      Family History  Problem Relation Age of Onset  . Hypertension Mother   . Cancer Sister     lung cancer  . Diabetes Brother   . Heart disease Brother     MI  . Frontotemporal dementia Brother     History   Social History  . Marital Status:  Married    Spouse Name: N/A    Number of Children: 4  . Years of Education: N/A   Occupational History  . 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 Smoking status: Current Every Day Smoker  . Smokeless tobacco: Never Used  . Alcohol Use: No  . Drug Use: No  . Sexual Activity: Not on file   Other Topics Concern  . Not on file   Social History Narrative   No living will   Requests her daughter, Caren Griffins, as health care POA. Discussed that she should do the formal paperwork since she prefers her husband not do this (as he will keep her on life support)   Would accept resuscitation attempts but no prolonged artificial ventilation.   No tube feeding if cognitively unaware   Review of Systems Some trouble from the pollen Still smokes--has tried but unable to stop. Has lowered the amount    Objective:   Physical Exam  Constitutional: She appears well-developed and well-nourished.  No distress.  Neck: Normal range of motion. Neck supple. No thyromegaly present.  Cardiovascular: Normal rate, regular rhythm, normal heart sounds and intact distal pulses.  Exam reveals no gallop.   No murmur heard. Pulmonary/Chest: Effort normal and breath sounds normal. No respiratory distress. She has no wheezes. She has no rales.  Musculoskeletal: She exhibits no edema and no tenderness.  Lymphadenopathy:    She has no cervical adenopathy.  Psychiatric:  Still has ongoing sadness Appropriate affect Voices family issues as usual          Assessment & Plan:

## 2014-03-30 NOTE — Progress Notes (Signed)
Pre visit review using our clinic review tool, if applicable. No additional management support is needed unless otherwise documented below in the visit note. 

## 2014-03-30 NOTE — Assessment & Plan Note (Signed)
Ongoing situational stress

## 2014-03-30 NOTE — Assessment & Plan Note (Signed)
BP Readings from Last 3 Encounters:  03/30/14 158/68  12/22/13 158/80  10/29/13 150/60   Usually high Seems to have ongoing white coat component No changes

## 2014-03-30 NOTE — Assessment & Plan Note (Signed)
Revascularized Still with good pulses and on appropriate meds Unfortunately, just unable to stop smoking

## 2014-03-30 NOTE — Assessment & Plan Note (Signed)
Lab Results  Component Value Date   LDLCALC 84 09/28/2013   At goal No problems with meds

## 2014-03-31 ENCOUNTER — Telehealth: Payer: Self-pay | Admitting: Internal Medicine

## 2014-03-31 NOTE — Telephone Encounter (Signed)
Relevant patient education mailed to patient.  

## 2014-04-30 ENCOUNTER — Encounter: Payer: Self-pay | Admitting: Internal Medicine

## 2014-05-17 ENCOUNTER — Other Ambulatory Visit: Payer: Self-pay | Admitting: *Deleted

## 2014-05-17 MED ORDER — TRAMADOL HCL 50 MG PO TABS
50.0000 mg | ORAL_TABLET | Freq: Two times a day (BID) | ORAL | Status: DC | PRN
Start: 2014-05-17 — End: 2014-06-22

## 2014-05-17 NOTE — Telephone Encounter (Signed)
rx called into pharmacy

## 2014-05-17 NOTE — Telephone Encounter (Signed)
Okay #60 x 0 

## 2014-05-17 NOTE — Telephone Encounter (Signed)
#   60 last filled 03/16/14, last ov was a f/u on 03/31/14.

## 2014-06-10 ENCOUNTER — Other Ambulatory Visit: Payer: Self-pay | Admitting: *Deleted

## 2014-06-10 MED ORDER — SIMVASTATIN 20 MG PO TABS: 20.0000 mg | ORAL_TABLET | Freq: Every evening | ORAL | Status: DC

## 2014-06-16 ENCOUNTER — Telehealth (HOSPITAL_COMMUNITY): Payer: Self-pay | Admitting: *Deleted

## 2014-06-22 ENCOUNTER — Other Ambulatory Visit: Payer: Self-pay | Admitting: *Deleted

## 2014-06-22 MED ORDER — TRAMADOL HCL 50 MG PO TABS: 50.0000 mg | ORAL_TABLET | Freq: Two times a day (BID) | ORAL | Status: DC | PRN

## 2014-06-22 NOTE — Telephone Encounter (Signed)
Patient also needs xanax refilled, last filled 03/29/14

## 2014-06-22 NOTE — Telephone Encounter (Signed)
Last office visit 03/30/2014.  Last refilled 05/17/2014 for #60 with no refills.  Ok to refill?

## 2014-06-22 NOTE — Telephone Encounter (Signed)
Okay #60 x 0 

## 2014-06-22 NOTE — Telephone Encounter (Signed)
rx called into pharmacy

## 2014-06-23 MED ORDER — ALPRAZOLAM 0.5 MG PO TABS: 0.5000 mg | ORAL_TABLET | Freq: Two times a day (BID) | ORAL | Status: DC | PRN

## 2014-06-23 NOTE — Telephone Encounter (Signed)
Okay #60 x 0 

## 2014-06-23 NOTE — Telephone Encounter (Signed)
rx called into pharmacy

## 2014-06-25 ENCOUNTER — Telehealth (HOSPITAL_COMMUNITY): Payer: Self-pay | Admitting: *Deleted

## 2014-07-01 MED ORDER — TRAMADOL HCL 50 MG PO TABS: 50.0000 mg | ORAL_TABLET | Freq: Two times a day (BID) | ORAL | Status: DC | PRN

## 2014-07-01 NOTE — Addendum Note (Signed)
Addended by: Helene Shoe on: 07/01/2014 09:27 AM   Modules accepted: Orders

## 2014-07-01 NOTE — Telephone Encounter (Signed)
Pt called to ck on status of tramadol refill; pt said picked up xanax but not tramadol; spoke with [place] and they did not get refill of Tramadol on 06/22/14. [facility] instructed.pt notified done.

## 2014-07-28 ENCOUNTER — Other Ambulatory Visit: Payer: Self-pay | Admitting: *Deleted

## 2014-07-28 MED ORDER — ALPRAZOLAM 0.5 MG PO TABS: 0.5000 mg | ORAL_TABLET | Freq: Two times a day (BID) | ORAL | Status: DC | PRN

## 2014-07-28 MED ORDER — TRAMADOL HCL 50 MG PO TABS: 50.0000 mg | ORAL_TABLET | Freq: Two times a day (BID) | ORAL | Status: DC | PRN

## 2014-07-28 NOTE — Telephone Encounter (Signed)
rx called into pharmacy

## 2014-07-28 NOTE — Telephone Encounter (Signed)
Xanax 7/15 Tramadol 7/23

## 2014-07-28 NOTE — Telephone Encounter (Signed)
Okay #60 x 0 for each

## 2014-09-14 ENCOUNTER — Other Ambulatory Visit: Payer: Self-pay | Admitting: *Deleted

## 2014-09-14 MED ORDER — TRAMADOL HCL 50 MG PO TABS: 50.0000 mg | ORAL_TABLET | Freq: Two times a day (BID) | ORAL | Status: DC | PRN

## 2014-09-14 MED ORDER — ALPRAZOLAM 0.5 MG PO TABS: 0.5000 mg | ORAL_TABLET | Freq: Two times a day (BID) | ORAL | Status: DC | PRN

## 2014-09-14 NOTE — Telephone Encounter (Signed)
Okay #60 x 0 for each

## 2014-09-14 NOTE — Telephone Encounter (Signed)
Rx's called into pharmacy.

## 2014-09-14 NOTE — Telephone Encounter (Signed)
Both meds last filled 07/28/14,pt has a future cpx appt scheduled for 10/22/14.

## 2014-09-17 ENCOUNTER — Other Ambulatory Visit: Payer: Self-pay | Admitting: *Deleted

## 2014-09-17 MED ORDER — AMLODIPINE BESYLATE 5 MG PO TABS: 5.0000 mg | ORAL_TABLET | Freq: Every day | ORAL | Status: DC

## 2014-09-21 ENCOUNTER — Ambulatory Visit (HOSPITAL_COMMUNITY)
Admission: RE | Admit: 2014-09-21 | Discharge: 2014-09-21 | Disposition: A | Discharge status: Home / Self Care | Payer: Medicare Other | Source: Ambulatory Visit | Attending: Cardiology | Admitting: Cardiology

## 2014-09-21 DIAGNOSIS — I672 Cerebral atherosclerosis: Secondary | ICD-10-CM | POA: Diagnosis present

## 2014-09-21 DIAGNOSIS — I6529 Occlusion and stenosis of unspecified carotid artery: Secondary | ICD-10-CM

## 2014-09-21 DIAGNOSIS — I6522 Occlusion and stenosis of left carotid artery: Secondary | ICD-10-CM

## 2014-09-21 NOTE — Progress Notes (Signed)
Carotid Duplex Completed. °Brianna L Mazza,RVT °

## 2014-09-30 ENCOUNTER — Other Ambulatory Visit: Payer: Self-pay | Admitting: *Deleted

## 2014-09-30 ENCOUNTER — Other Ambulatory Visit: Payer: Self-pay

## 2014-09-30 DIAGNOSIS — I739 Peripheral vascular disease, unspecified: Principal | ICD-10-CM

## 2014-09-30 DIAGNOSIS — I779 Disorder of arteries and arterioles, unspecified: Secondary | ICD-10-CM

## 2014-09-30 MED ORDER — CLOPIDOGREL BISULFATE 75 MG PO TABS: 75.0000 mg | ORAL_TABLET | Freq: Every day | ORAL | Status: DC

## 2014-09-30 NOTE — Telephone Encounter (Signed)
Rx sent to pharmacy   

## 2014-10-12 ENCOUNTER — Ambulatory Visit (INDEPENDENT_AMBULATORY_CARE_PROVIDER_SITE_OTHER): Payer: Medicare Other | Admitting: Internal Medicine

## 2014-10-12 ENCOUNTER — Encounter: Payer: Self-pay | Admitting: Internal Medicine

## 2014-10-12 VITALS — BP 158/60 | HR 73 | Temp 98.2°F | Ht 61.0 in | Wt 108.0 lb

## 2014-10-12 DIAGNOSIS — I2510 Atherosclerotic heart disease of native coronary artery without angina pectoris: Secondary | ICD-10-CM

## 2014-10-12 DIAGNOSIS — Z0000 Encounter for general adult medical examination without abnormal findings: Secondary | ICD-10-CM

## 2014-10-12 DIAGNOSIS — I10 Essential (primary) hypertension: Secondary | ICD-10-CM

## 2014-10-12 DIAGNOSIS — E785 Hyperlipidemia, unspecified: Secondary | ICD-10-CM

## 2014-10-12 DIAGNOSIS — F39 Unspecified mood [affective] disorder: Secondary | ICD-10-CM

## 2014-10-12 LAB — CBC WITH DIFFERENTIAL/PLATELET
BASOS PCT: 0.8 % (ref 0.0–3.0)
Basophils Absolute: 0.1 10*3/uL (ref 0.0–0.1)
EOS PCT: 2.1 % (ref 0.0–5.0)
Eosinophils Absolute: 0.2 10*3/uL (ref 0.0–0.7)
HCT: 33.4 % — ABNORMAL LOW (ref 36.0–46.0)
HEMOGLOBIN: 10.7 g/dL — AB (ref 12.0–15.0)
LYMPHS PCT: 23 % (ref 12.0–46.0)
Lymphs Abs: 2 10*3/uL (ref 0.7–4.0)
MCHC: 32.2 g/dL (ref 30.0–36.0)
MCV: 80.3 fl (ref 78.0–100.0)
Monocytes Absolute: 0.6 10*3/uL (ref 0.1–1.0)
Monocytes Relative: 6.3 % (ref 3.0–12.0)
NEUTROS ABS: 6 10*3/uL (ref 1.4–7.7)
Neutrophils Relative %: 67.8 % (ref 43.0–77.0)
Platelets: 331 10*3/uL (ref 150.0–400.0)
RBC: 4.16 Mil/uL (ref 3.87–5.11)
RDW: 19.9 % — ABNORMAL HIGH (ref 11.5–15.5)
WBC: 8.9 10*3/uL (ref 4.0–10.5)

## 2014-10-12 LAB — T4, FREE: Free T4: 0.78 ng/dL (ref 0.60–1.60)

## 2014-10-12 LAB — LIPID PANEL
CHOL/HDL RATIO: 3
Cholesterol: 135 mg/dL (ref 0–200)
HDL: 50.7 mg/dL (ref >39.00)
LDL CALC: 69 mg/dL (ref 0–99)
NonHDL: 84.3
TRIGLYCERIDES: 78 mg/dL (ref 0.0–149.0)
VLDL: 15.6 mg/dL (ref 0.0–40.0)

## 2014-10-12 LAB — COMPREHENSIVE METABOLIC PANEL
ALT: 12 U/L (ref 0–35)
AST: 18 U/L (ref 0–37)
Albumin: 3.4 g/dL — ABNORMAL LOW (ref 3.5–5.2)
Alkaline Phosphatase: 67 U/L (ref 39–117)
BUN: 15 mg/dL (ref 6–23)
CALCIUM: 9.3 mg/dL (ref 8.4–10.5)
CHLORIDE: 97 meq/L (ref 96–112)
CO2: 31 mEq/L (ref 19–32)
Creatinine, Ser: 1.3 mg/dL — ABNORMAL HIGH (ref 0.4–1.2)
GFR: 43.52 mL/min — ABNORMAL LOW (ref >60.00)
Glucose, Bld: 85 mg/dL (ref 70–99)
Potassium: 3.8 mEq/L (ref 3.5–5.1)
Sodium: 132 mEq/L — ABNORMAL LOW (ref 135–145)
Total Bilirubin: 0.5 mg/dL (ref 0.2–1.2)
Total Protein: 7.1 g/dL (ref 6.0–8.3)

## 2014-10-12 NOTE — Progress Notes (Signed)
Pre visit review using our clinic review tool, if applicable. No additional management support is needed unless otherwise documented below in the visit note. 

## 2014-10-12 NOTE — Assessment & Plan Note (Signed)
Need to continue statin for secondary prevention Due for labs

## 2014-10-12 NOTE — Assessment & Plan Note (Signed)
Continues with cardiologist and PVD screening (from AAA stent)

## 2014-10-12 NOTE — Assessment & Plan Note (Signed)
I have personally reviewed the Medicare Annual Wellness questionnaire and have noted 1. The patient's medical and social history 2. Their use of alcohol, tobacco or illicit drugs 3. Their current medications and supplements 4. The patient's functional ability including ADL's, fall risks, home safety risks and hearing or visual             impairment. 5. Diet and physical activities 6. Evidence for depression or mood disorders  The patients weight, height, BMI and visual acuity have been recorded in the chart I have made referrals, counseling and provided education to the patient based review of the above and I have provided the pt with a written personalized care plan for preventive services.  I have provided you with a copy of your personalized plan for preventive services. Please take the time to review along with your updated medication list.  Prefers no Td or mammogram (and appropriate for her age) Recent colonoscopy Immunizations UTD but will need prevnar (can't give due to recent flu vaccine) Not able to stop smoking--discussed

## 2014-10-12 NOTE — Assessment & Plan Note (Signed)
BP Readings from Last 3 Encounters:  10/12/14 158/60  03/30/14 158/68  12/22/13 158/80   Consistent white coat HTN Stable No cardiac symptoms No med changes for now

## 2014-10-12 NOTE — Progress Notes (Signed)
Subjective:    Patient ID: Paige Ortiz, female    DOB: July 22, 1933, 80 y.o.   MRN: [id number]  HPI Here for Medicare wellness and physical Reviewed form and advanced directives Reviewed her other physicians Still smokes--- just can't quit due to her stress levels No alcohol No regular exercise but manages all her instrumental ADLs Hasn't noticed cognitive problems Hearing and vision are okay  Ongoing stress and depressed mood Brother now in facility-- locked memory care Had to pull him from bad place [place] and keep him in her home for 10 days awaiting a better place High anxiety at times-- tough dealing with all her family stress, etc  Continues with her cardiology and vascular follow up No recent symptoms  Current Outpatient Prescriptions on File Prior to Visit  Medication Sig Dispense Refill  . ALPRAZolam (XANAX) 0.5 MG tablet Take 1 tablet (0.5 mg total) by mouth 2 (two) times daily as needed. 60 tablet 0  . amLODipine (NORVASC) 5 MG tablet Take 1 tablet (5 mg total) by mouth daily. 30 tablet 5  . aspirin 81 MG tablet Take 81 mg by mouth daily.      . celecoxib (CELEBREX) 200 MG capsule Take 1 capsule (200 mg total) by mouth daily. 30 capsule 11  . cholecalciferol (VITAMIN D) 1000 UNITS tablet Take 1,000 Units by mouth daily.    . clopidogrel (PLAVIX) 75 MG tablet Take 1 tablet (75 mg total) by mouth daily. 30 tablet 0  . ranitidine (ZANTAC) 150 MG tablet Take 150 mg by mouth 2 (two) times daily.     . simvastatin (ZOCOR) 20 MG tablet Take 1 tablet (20 mg total) by mouth every evening. 30 tablet 11  . traMADol (ULTRAM) 50 MG tablet Take 1 tablet (50 mg total) by mouth 2 (two) times daily as needed. 60 tablet 0  . valsartan-hydrochlorothiazide (DIOVAN HCT) 80-12.5 MG per tablet Take 1 tablet by mouth daily. 90 tablet 3   No current facility-administered medications on file prior to visit.    Allergies  Allergen Reactions  . Clindamycin/Lincomycin Hives  . Bupropion Hcl      REACTION: Suicidal thoughts  . Hydrocod Polst-Cpm Polst Er     REACTION: Can't wake up  . Losartan Potassium-Hctz     REACTION: swelling in feet and legs  . Sertraline Hcl     REACTION: Tired/ sleepy  . Sulfonamide Derivatives   . Citalopram     Made her sleepy    Past Medical History  Diagnosis Date  . Anxiety   . Hyperlipidemia   . Hypertension   . Disorder of bone and cartilage, unspecified   . Allergy   . GERD (gastroesophageal reflux disease)   . ASCVD (arteriosclerotic cardiovascular disease)   . Breast tumor     left, benign  . Cataract   . Tubular adenoma of colon 6/14    Dr Vira Agar  . Tobacco abuse     Past Surgical History  Procedure Laterality Date  . Carotid endarterectomy    . Descending aortic aneurysm repair w/ stent      Family History  Problem Relation Age of Onset  . Hypertension Mother   . Cancer Sister     lung cancer  . Diabetes Brother   . Heart disease Brother     MI  . Frontotemporal dementia Brother     History   Social History  . Marital Status: Married    Spouse Name: N/A    Number of  Children: 4  . Years of Education: N/A   Occupational History  . 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 Smoking status: Current Every Day Smoker  . Smokeless tobacco: Never Used  . Alcohol Use: No  . Drug Use: No  . Sexual Activity: Not on file   Other Topics Concern  . Not on file   Social History Narrative   No living will   Requests her daughter, Caren Griffins, as health care POA. Discussed that she should do the formal paperwork since she prefers her husband not do this (as he will keep her on life support)   Would accept resuscitation attempts but no prolonged artificial ventilation.   No tube feeding if cognitively unaware   Review of Systems  Constitutional: Negative for fatigue and unexpected weight change.       Wears seat belt  HENT: Negative for dental problem, hearing loss and tinnitus.         Regular with dentist  Eyes: Positive for visual disturbance.       Some changes since cataract surgery  Respiratory: Negative for cough, chest tightness and shortness of breath.   Cardiovascular: Positive for leg swelling. Negative for chest pain and palpitations.       Mild afternoon foot swelling  Gastrointestinal: Negative for nausea, vomiting, abdominal pain, constipation and blood in stool.       Bowels are better  Endocrine: Negative for polydipsia and polyuria.  Genitourinary: Negative for dysuria and hematuria.  Musculoskeletal: Positive for myalgias. Negative for back pain, joint swelling and arthralgias.       Intermittent leg cramps--night. Mustard helps.  Skin: Negative for rash.       Has "old age spots" but nothing suspicious  Allergic/Immunologic: Positive for environmental allergies. Negative for immunocompromised state.  Neurological: Negative for dizziness, syncope, weakness, light-headedness, numbness and headaches.  Hematological: Negative for adenopathy. Bruises/bleeds easily.  Psychiatric/Behavioral: Positive for sleep disturbance and dysphoric mood. The patient is nervous/anxious.        Objective:   Physical Exam  Constitutional: She is oriented to person, place, and time. She appears well-developed and well-nourished. No distress.  HENT:  Head: Normocephalic and atraumatic.  Right Ear: External ear normal.  Left Ear: External ear normal.  Mouth/Throat: Oropharynx is clear and moist. No oropharyngeal exudate.  Eyes: Conjunctivae and EOM are normal. Pupils are equal, round, and reactive to light.  Neck: Normal range of motion. Neck supple. No thyromegaly present.  Cardiovascular: Normal rate, regular rhythm, normal heart sounds and intact distal pulses.  Exam reveals no gallop.   No murmur heard. Pulmonary/Chest: Effort normal and breath sounds normal. No respiratory distress. She has no wheezes. She has no rales.  Abdominal: Soft. There is no tenderness.    Musculoskeletal: She exhibits no edema or tenderness.  Lymphadenopathy:    She has no cervical adenopathy.  Neurological: She is alert and oriented to person, place, and time.  President -- "Obama, I don't know, Remer Macho" [id number] Recall 3/3  Skin: No rash noted. No erythema.  Psychiatric:  Usual litany of stress, etc No overt depression          Assessment & Plan:

## 2014-10-12 NOTE — Assessment & Plan Note (Signed)
Chronic anxiety Dysthymia with stress with brother, etc (always has some family stress)

## 2014-10-13 ENCOUNTER — Encounter: Payer: Self-pay | Admitting: *Deleted

## 2014-10-27 ENCOUNTER — Other Ambulatory Visit: Payer: Self-pay | Admitting: *Deleted

## 2014-10-27 NOTE — Telephone Encounter (Signed)
Both meds last filled on 09/14/14, last appt was cpx on 10/12/14.

## 2014-10-27 NOTE — Telephone Encounter (Signed)
Approved: okay #60 x0 for each of them

## 2014-10-28 MED ORDER — ALPRAZOLAM 0.5 MG PO TABS: 0.5000 mg | ORAL_TABLET | Freq: Two times a day (BID) | ORAL | Status: DC | PRN

## 2014-10-28 MED ORDER — TRAMADOL HCL 50 MG PO TABS: 50.0000 mg | ORAL_TABLET | Freq: Two times a day (BID) | ORAL | Status: DC | PRN

## 2014-10-28 NOTE — Telephone Encounter (Signed)
rx called into pharmacy

## 2014-12-06 ENCOUNTER — Other Ambulatory Visit: Payer: Self-pay

## 2014-12-06 MED ORDER — CLOPIDOGREL BISULFATE 75 MG PO TABS: 75.0000 mg | ORAL_TABLET | Freq: Every day | ORAL | Status: DC

## 2014-12-06 NOTE — Telephone Encounter (Signed)
Rx sent to pharmacy   

## 2014-12-16 ENCOUNTER — Other Ambulatory Visit: Payer: Self-pay | Admitting: *Deleted

## 2014-12-16 MED ORDER — ALPRAZOLAM 0.5 MG PO TABS: 0.5000 mg | ORAL_TABLET | Freq: Two times a day (BID) | ORAL | Status: DC | PRN

## 2014-12-16 MED ORDER — TRAMADOL HCL 50 MG PO TABS: 50.0000 mg | ORAL_TABLET | Freq: Two times a day (BID) | ORAL | Status: DC | PRN

## 2014-12-16 NOTE — Telephone Encounter (Signed)
Both last filled 10/28/2014

## 2014-12-16 NOTE — Telephone Encounter (Signed)
Approved: #60 x 0 for both 

## 2014-12-16 NOTE — Telephone Encounter (Signed)
rx called into pharmacy

## 2015-01-18 ENCOUNTER — Other Ambulatory Visit: Payer: Self-pay | Admitting: *Deleted

## 2015-01-18 NOTE — Telephone Encounter (Signed)
#  60 alprazolam and # 60 tramadol last filled 12/20/14. Pt's last ov was a cpx on 11/31/15.

## 2015-01-19 MED ORDER — ALPRAZOLAM 0.5 MG PO TABS: 0.5000 mg | ORAL_TABLET | Freq: Two times a day (BID) | ORAL | Status: DC | PRN

## 2015-01-19 MED ORDER — TRAMADOL HCL 50 MG PO TABS: 50.0000 mg | ORAL_TABLET | Freq: Two times a day (BID) | ORAL | Status: DC | PRN

## 2015-01-19 NOTE — Telephone Encounter (Signed)
Rx called in to pharmacy. 

## 2015-01-19 NOTE — Telephone Encounter (Signed)
Approved: #60 x 0 for each

## 2015-02-07 ENCOUNTER — Other Ambulatory Visit: Payer: Self-pay

## 2015-02-07 MED ORDER — VALSARTAN-HYDROCHLOROTHIAZIDE 80-12.5 MG PO TABS: 1.0000 | ORAL_TABLET | Freq: Every day | ORAL | Status: AC

## 2015-02-18 ENCOUNTER — Other Ambulatory Visit: Payer: Self-pay | Admitting: *Deleted

## 2015-02-18 MED ORDER — CLOPIDOGREL BISULFATE 75 MG PO TABS: 75.0000 mg | ORAL_TABLET | Freq: Every day | ORAL | Status: DC

## 2015-03-03 ENCOUNTER — Other Ambulatory Visit: Payer: Self-pay | Admitting: *Deleted

## 2015-03-03 MED ORDER — TRAMADOL HCL 50 MG PO TABS: 50.0000 mg | ORAL_TABLET | Freq: Two times a day (BID) | ORAL | Status: DC | PRN

## 2015-03-03 MED ORDER — ALPRAZOLAM 0.5 MG PO TABS: 0.5000 mg | ORAL_TABLET | Freq: Two times a day (BID) | ORAL | Status: DC | PRN

## 2015-03-03 NOTE — Telephone Encounter (Signed)
rx called into pharmacy

## 2015-03-03 NOTE — Telephone Encounter (Signed)
Last filled 01/19/15  [facility] PATIENT, Please send back to me for call in

## 2015-03-24 ENCOUNTER — Other Ambulatory Visit: Payer: Self-pay

## 2015-03-24 MED ORDER — CLOPIDOGREL BISULFATE 75 MG PO TABS: 75.0000 mg | ORAL_TABLET | Freq: Every day | ORAL | Status: DC

## 2015-03-24 NOTE — Telephone Encounter (Signed)
Rx(s) sent to pharmacy electronically.  

## 2015-03-25 ENCOUNTER — Other Ambulatory Visit: Payer: Self-pay | Admitting: *Deleted

## 2015-03-25 MED ORDER — CELECOXIB 200 MG PO CAPS: 200.0000 mg | ORAL_CAPSULE | Freq: Every day | ORAL | Status: AC

## 2015-03-25 NOTE — Telephone Encounter (Signed)
rx sent to pharmacy by e-script  

## 2015-04-27 ENCOUNTER — Other Ambulatory Visit: Payer: Self-pay | Admitting: *Deleted

## 2015-04-27 MED ORDER — RANITIDINE HCL 150 MG PO TABS: 150.0000 mg | ORAL_TABLET | Freq: Two times a day (BID) | ORAL | Status: AC

## 2015-05-12 ENCOUNTER — Other Ambulatory Visit: Payer: Self-pay | Admitting: *Deleted

## 2015-05-12 MED ORDER — ALPRAZOLAM 0.5 MG PO TABS: 0.5000 mg | ORAL_TABLET | Freq: Two times a day (BID) | ORAL | Status: DC | PRN

## 2015-05-12 NOTE — Telephone Encounter (Signed)
rx called into pharmacy

## 2015-05-12 NOTE — Telephone Encounter (Signed)
Approved: #60 x 0 

## 2015-05-12 NOTE — Telephone Encounter (Signed)
03/03/15 

## 2015-05-13 ENCOUNTER — Other Ambulatory Visit: Payer: Self-pay

## 2015-05-13 NOTE — Telephone Encounter (Signed)
Pt very upset because was advised by [facility] that no more refills on Plavix until can be seen; advised pt the refill request for plavix was sent to Dr Gwenlyn Found not Dr Silvio Pate, pt wants to know if Dr Silvio Pate will prescribe the plavix for her to [place]. Pt request cb. Last annual exam 10/12/14 and pt has CPX scheduled 10/17/15.Please advise.

## 2015-05-14 MED ORDER — CLOPIDOGREL BISULFATE 75 MG PO TABS: 75.0000 mg | ORAL_TABLET | Freq: Every day | ORAL | Status: AC

## 2015-05-14 NOTE — Telephone Encounter (Signed)
Rx sent through [facility]-scribe  

## 2015-05-14 NOTE — Telephone Encounter (Signed)
Approved: okay to fill for a year 

## 2015-05-19 ENCOUNTER — Other Ambulatory Visit: Payer: Self-pay | Admitting: *Deleted

## 2015-05-19 MED ORDER — AMLODIPINE BESYLATE 5 MG PO TABS: 5.0000 mg | ORAL_TABLET | Freq: Every day | ORAL | Status: AC

## 2015-05-27 ENCOUNTER — Other Ambulatory Visit: Payer: Self-pay | Admitting: *Deleted

## 2015-05-27 MED ORDER — SIMVASTATIN 20 MG PO TABS: 20.0000 mg | ORAL_TABLET | Freq: Every evening | ORAL | Status: AC

## 2015-06-08 ENCOUNTER — Telehealth: Payer: Self-pay

## 2015-06-08 NOTE — Telephone Encounter (Signed)
Pt wants to see a doctor in geriatrics at [facility] at [facility]; pt already has appt at [facility] later this month. Pt request copy of records sent to geriatrics; pt will come by office this week to sign for medical records to go to [facility].

## 2015-06-15 ENCOUNTER — Other Ambulatory Visit: Payer: Self-pay | Admitting: *Deleted

## 2015-06-15 NOTE — Telephone Encounter (Addendum)
03/03/15  Xanax 05/12/15

## 2015-06-16 MED ORDER — TRAMADOL HCL 50 MG PO TABS: 50.0000 mg | ORAL_TABLET | Freq: Two times a day (BID) | ORAL | Status: AC | PRN

## 2015-06-16 MED ORDER — ALPRAZOLAM 0.5 MG PO TABS: 0.5000 mg | ORAL_TABLET | Freq: Two times a day (BID) | ORAL | Status: AC | PRN

## 2015-06-16 NOTE — Telephone Encounter (Signed)
rx called into pharmacy

## 2015-06-16 NOTE — Telephone Encounter (Signed)
Approved: okay each #60 x 0

## 2015-07-21 ENCOUNTER — Encounter: Payer: Self-pay | Admitting: *Deleted

## 2015-09-07 ENCOUNTER — Encounter: Payer: Self-pay | Admitting: Cardiovascular Disease

## 2015-10-11 DEATH — deceased

## 2015-10-17 ENCOUNTER — Encounter: Payer: Medicare Other | Admitting: Internal Medicine
